# Patient Record
Sex: Male | Born: 1955 | Race: White | Hispanic: No | Marital: Married | Smoking: Current every day smoker
Health system: Southern US, Community
[De-identification: ages and names within clinical notes are randomized; demographics above are authoritative.]

## PROBLEM LIST (undated history)

## (undated) DIAGNOSIS — M549 Dorsalgia, unspecified: Secondary | ICD-10-CM

## (undated) DIAGNOSIS — Z87442 Personal history of urinary calculi: Secondary | ICD-10-CM

## (undated) DIAGNOSIS — I10 Essential (primary) hypertension: Secondary | ICD-10-CM

## (undated) DIAGNOSIS — F17200 Nicotine dependence, unspecified, uncomplicated: Secondary | ICD-10-CM

## (undated) DIAGNOSIS — I2510 Atherosclerotic heart disease of native coronary artery without angina pectoris: Secondary | ICD-10-CM

## (undated) DIAGNOSIS — G4730 Sleep apnea, unspecified: Secondary | ICD-10-CM

## (undated) DIAGNOSIS — M48062 Spinal stenosis, lumbar region with neurogenic claudication: Secondary | ICD-10-CM

## (undated) DIAGNOSIS — M1990 Unspecified osteoarthritis, unspecified site: Secondary | ICD-10-CM

## (undated) HISTORY — DX: Dorsalgia, unspecified: M54.9

## (undated) HISTORY — PX: BACK SURGERY: SHX140

## (undated) HISTORY — PX: CARPAL TUNNEL RELEASE: SHX101

## (undated) HISTORY — PX: CORONARY ARTERY BYPASS GRAFT: SHX141

## (undated) HISTORY — PX: APPENDECTOMY: SHX54

## (undated) HISTORY — PX: KNEE ARTHROSCOPY: SUR90

## (undated) HISTORY — PX: WISDOM TOOTH EXTRACTION: SHX21

## (undated) HISTORY — PX: COLONOSCOPY: SHX174

---

## 2001-10-02 ENCOUNTER — Encounter: Payer: Self-pay | Admitting: Emergency Medicine

## 2001-10-02 ENCOUNTER — Emergency Department (HOSPITAL_COMMUNITY): Admission: EM | Admit: 2001-10-02 | Discharge: 2001-10-02 | Payer: Self-pay | Admitting: Emergency Medicine

## 2013-06-20 ENCOUNTER — Encounter (INDEPENDENT_AMBULATORY_CARE_PROVIDER_SITE_OTHER): Payer: Self-pay

## 2013-06-20 ENCOUNTER — Ambulatory Visit (INDEPENDENT_AMBULATORY_CARE_PROVIDER_SITE_OTHER): Payer: BC Managed Care – PPO | Admitting: Family Medicine

## 2013-06-20 VITALS — BP 125/69 | HR 56 | Temp 98.4°F | Resp 16 | Ht 76.0 in | Wt 262.0 lb

## 2013-06-20 DIAGNOSIS — J111 Influenza due to unidentified influenza virus with other respiratory manifestations: Secondary | ICD-10-CM

## 2013-06-20 DIAGNOSIS — R059 Cough, unspecified: Secondary | ICD-10-CM

## 2013-06-20 DIAGNOSIS — J101 Influenza due to other identified influenza virus with other respiratory manifestations: Secondary | ICD-10-CM

## 2013-06-20 LAB — POCT INFLUENZA A/B
POCT Rapid Influenza A AG: POSITIVE — AB
POCT Rapid Influenza B AG: NEGATIVE

## 2013-06-20 MED ORDER — GUAIFENESIN-CODEINE 100-10 MG/5ML PO SOLN
5.0000 mL | Freq: Three times a day (TID) | ORAL | Status: AC | PRN
Start: 2013-06-20 — End: 2013-06-30

## 2013-06-20 NOTE — Patient Instructions (Signed)
Viral Infection    Antibiotics not indicated    May take robitussin AC but will make you tired and cannot take when working or driving      Please take either 2 extra strength tylenol every 8 hrs or 2 aleve in the AM and 2 in the PM with food    If symptoms not improving, fevers, etc please follow-up

## 2013-06-20 NOTE — Progress Notes (Signed)
Subjective:       Patient ID: Rick Mcdonald is a 58 y.o. male.    HPI  + tob  Works about 14 days straight and goes home every other weekend  Was working this weekend and one of the guys c sim. Complaints  Mild cough and runny nose  Ms aches  Fevers  No n/v/has or facial pain  The following portions of the patient's history were reviewed and updated as appropriate: allergies, current medications, past family history, past medical history, past social history, past surgical history and problem list.    Review of Systems        Objective:     Physical Exam   Nursing note and vitals reviewed.  Constitutional: He is oriented to person, place, and time. He appears well-developed and well-nourished.   HENT:   Right Ear: Tympanic membrane and external ear normal.   Left Ear: Tympanic membrane and external ear normal.   Eyes: Conjunctivae normal are normal.   Neck: Normal range of motion. Neck supple.   Cardiovascular: Normal rate, regular rhythm and normal heart sounds.    Pulmonary/Chest: Effort normal and breath sounds normal. No respiratory distress. He has no wheezes.   Lymphadenopathy:     He has no cervical adenopathy.   Neurological: He is alert and oriented to person, place, and time.   Skin: Skin is warm and dry.           Assessment:       Flu A      Plan:       See avs and orders

## 2017-06-23 ENCOUNTER — Other Ambulatory Visit: Payer: Self-pay | Admitting: Neurosurgery

## 2017-06-28 NOTE — Pre-Procedure Instructions (Signed)
Brian Franco  06/28/2017     No Pharmacies Listed   Your procedure is scheduled on Feb 26  Report to [facility] Admitting at [phone] A.M.  Call this number if you have problems the morning of surgery:  [phone]   Remember:  Do not eat food or drink liquids after midnight.  Take these medicines the morning of surgery with A SIP OF WATER None  Stop taking aspirin, BC's, Goody's, Herbal medications, Fish Oil, Aleve, Ibuprofen, Advil, motrin, Vitamins    Do not wear jewelry, make-up or nail polish.  Do not wear lotions, powders, or perfumes, or deodorant.  Do not shave 48 hours prior to surgery.  Men may shave face and neck.  Do not bring valuables to the hospital.  [facility] is not responsible for any belongings or valuables.  Contacts, dentures or bridgework may not be worn into surgery.  Leave your suitcase in the car.  After surgery it may be brought to your room.  For patients admitted to the hospital, discharge time will be determined by your treatment team.  Patients discharged the day of surgery will not be allowed to drive home.   Special instructions:  [facility] - Preparing for Surgery  Before surgery, you can play an important role.  Because skin is not sterile, your skin needs to be as free of germs as possible.  You can reduce the number of germs on you skin by washing with CHG (chlorahexidine gluconate) soap before surgery.  CHG is an antiseptic cleaner which kills germs and bonds with the skin to continue killing germs even after washing.  Please DO NOT use if you have an allergy to CHG or antibacterial soaps.  If your skin becomes reddened/irritated stop using the CHG and inform your nurse when you arrive at Short Stay.  Do not shave (including legs and underarms) for at least 48 hours prior to the first CHG shower.  You may shave your face.  Please follow these instructions carefully:   1.  Shower with CHG Soap the night before surgery and the    morning of Surgery.  2.  If you choose to wash your hair, wash your hair first as usual with your  normal shampoo.  3.  After you shampoo, rinse your hair and body thoroughly to remove the   Shampoo.  4.  Use CHG as you would any other liquid soap.  You can apply chg directly to the skin and wash gently with scrungie or a clean washcloth.  5.  Apply the CHG Soap to your body ONLY FROM THE NECK DOWN.   Do not use on open wounds or open sores.  Avoid contact with your eyes,  ears, mouth and genitals (private parts).  Wash genitals (private parts)       with your normal soap.  6.  Wash thoroughly, paying special attention to the area where your surgery will be performed.  7.  Thoroughly rinse your body with warm water from the neck down.  8.  DO NOT shower/wash with your normal soap after using and rinsing off the CHG Soap.  9.  Pat yourself dry with a clean towel.            10.  Wear clean pajamas.            11.  Place clean sheets on your bed the night of your first shower and do not sleep with pets.  Day of Surgery  Do not apply any lotions/deoderants the morning of surgery.  Please wear clean clothes to the hospital/surgery center.     Please read over the following fact sheets that you were given. Pain Booklet, Coughing and Deep Breathing, MRSA Information and Surgical Site Infection Prevention

## 2017-06-29 ENCOUNTER — Encounter (HOSPITAL_COMMUNITY): Payer: Self-pay

## 2017-06-29 ENCOUNTER — Other Ambulatory Visit: Payer: Self-pay

## 2017-06-29 ENCOUNTER — Encounter (HOSPITAL_COMMUNITY)
Admission: RE | Admit: 2017-06-29 | Discharge: 2017-06-29 | Disposition: A | Discharge status: Home / Self Care | Payer: PRIVATE HEALTH INSURANCE | Source: Ambulatory Visit | Attending: Neurosurgery | Admitting: Neurosurgery

## 2017-06-29 DIAGNOSIS — Z01818 Encounter for other preprocedural examination: Secondary | ICD-10-CM | POA: Diagnosis present

## 2017-06-29 HISTORY — DX: Spinal stenosis, lumbar region with neurogenic claudication: M48.062

## 2017-06-29 HISTORY — DX: Sleep apnea, unspecified: G47.30

## 2017-06-29 LAB — SURGICAL PCR SCREEN
MRSA, PCR: NEGATIVE
Staphylococcus aureus: POSITIVE — AB

## 2017-06-29 LAB — BASIC METABOLIC PANEL
ANION GAP: 11 (ref 5–15)
BUN: 25 mg/dL — ABNORMAL HIGH (ref 6–20)
CALCIUM: 9.6 mg/dL (ref 8.9–10.3)
CO2: 22 mmol/L (ref 22–32)
Chloride: 107 mmol/L (ref 101–111)
Creatinine, Ser: 0.88 mg/dL (ref 0.61–1.24)
GFR calc Af Amer: >60 mL/min (ref >60)
GLUCOSE: 100 mg/dL — AB (ref 65–99)
Potassium: 4 mmol/L (ref 3.5–5.1)
Sodium: 140 mmol/L (ref 135–145)

## 2017-06-29 LAB — CBC
HCT: 43.3 % (ref 39.0–52.0)
Hemoglobin: 14.5 g/dL (ref 13.0–17.0)
MCH: 30 pg (ref 26.0–34.0)
MCHC: 33.5 g/dL (ref 30.0–36.0)
MCV: 89.5 fL (ref 78.0–100.0)
PLATELETS: 260 10*3/uL (ref 150–400)
RBC: 4.84 MIL/uL (ref 4.22–5.81)
RDW: 13 % (ref 11.5–15.5)
WBC: 9.4 10*3/uL (ref 4.0–10.5)

## 2017-06-29 LAB — TYPE AND SCREEN
ABO/RH(D): A NEG
Antibody Screen: NEGATIVE

## 2017-06-29 LAB — ABO/RH: ABO/RH(D): A NEG

## 2017-06-29 NOTE — Progress Notes (Signed)
Mupirocin Ointment Rx called into [facility] on [address] in [place] for positive PCR of Staph. Pt notified and voiced understanding.

## 2017-06-29 NOTE — Progress Notes (Signed)
[facility]  Cardiologist - Denies  Chest x-ray - Denies  EKG - Denies  Stress Test - Denies  ECHO - Denies  Cardiac Cath - Denies  Sleep Study - Yes- Positive CPAP - Yes- Told to bring mask DOS  LABS- 06/29/17: CBC, BMP, T/S  Anesthesia- No  Pt denies having chest pain, sob, or fever at this time. All instructions explained to the pt, with a verbal understanding of the material. Pt agrees to go over the instructions while at home for a better understanding. The opportunity to ask questions was provided.

## 2017-07-05 ENCOUNTER — Encounter (HOSPITAL_COMMUNITY): Payer: Self-pay | Admitting: Anesthesiology

## 2017-07-05 ENCOUNTER — Inpatient Hospital Stay (HOSPITAL_COMMUNITY): Payer: PRIVATE HEALTH INSURANCE | Admitting: Anesthesiology

## 2017-07-05 ENCOUNTER — Inpatient Hospital Stay (HOSPITAL_COMMUNITY)
Admission: RE | Admit: 2017-07-05 | Discharge: 2017-07-06 | DRG: 455 | Disposition: A | Discharge status: Home / Self Care | Payer: PRIVATE HEALTH INSURANCE | Source: Ambulatory Visit | Attending: Neurosurgery | Admitting: Neurosurgery

## 2017-07-05 ENCOUNTER — Inpatient Hospital Stay (HOSPITAL_COMMUNITY)
Admission: RE | Disposition: A | Discharge status: Home / Self Care | Payer: Self-pay | Source: Ambulatory Visit | Attending: Neurosurgery

## 2017-07-05 ENCOUNTER — Inpatient Hospital Stay (HOSPITAL_COMMUNITY): Payer: PRIVATE HEALTH INSURANCE

## 2017-07-05 DIAGNOSIS — F17210 Nicotine dependence, cigarettes, uncomplicated: Secondary | ICD-10-CM | POA: Diagnosis present

## 2017-07-05 DIAGNOSIS — M21372 Foot drop, left foot: Secondary | ICD-10-CM | POA: Diagnosis present

## 2017-07-05 DIAGNOSIS — M5116 Intervertebral disc disorders with radiculopathy, lumbar region: Secondary | ICD-10-CM | POA: Diagnosis present

## 2017-07-05 DIAGNOSIS — I10 Essential (primary) hypertension: Secondary | ICD-10-CM | POA: Diagnosis present

## 2017-07-05 DIAGNOSIS — M48061 Spinal stenosis, lumbar region without neurogenic claudication: Secondary | ICD-10-CM | POA: Diagnosis present

## 2017-07-05 DIAGNOSIS — M48062 Spinal stenosis, lumbar region with neurogenic claudication: Principal | ICD-10-CM | POA: Diagnosis present

## 2017-07-05 DIAGNOSIS — G4730 Sleep apnea, unspecified: Secondary | ICD-10-CM | POA: Diagnosis present

## 2017-07-05 DIAGNOSIS — E7800 Pure hypercholesterolemia, unspecified: Secondary | ICD-10-CM | POA: Diagnosis present

## 2017-07-05 DIAGNOSIS — M545 Low back pain: Secondary | ICD-10-CM | POA: Diagnosis present

## 2017-07-05 DIAGNOSIS — Z419 Encounter for procedure for purposes other than remedying health state, unspecified: Secondary | ICD-10-CM

## 2017-07-05 SURGERY — POSTERIOR LUMBAR FUSION 2 LEVEL
Anesthesia: General | Site: Back

## 2017-07-05 MED ORDER — METHOCARBAMOL 500 MG PO TABS
500.0000 mg | ORAL_TABLET | Freq: Four times a day (QID) | ORAL | Status: DC | PRN
  Administered 2017-07-05: 500 mg via ORAL

## 2017-07-05 MED ORDER — SODIUM CHLORIDE 0.9% FLUSH: 3.0000 mL | Freq: Two times a day (BID) | INTRAVENOUS | Status: DC

## 2017-07-05 MED ORDER — LIDOCAINE-EPINEPHRINE 1 %-1:100000 IJ SOLN
INTRAMUSCULAR | Status: DC | PRN
  Administered 2017-07-05: 5 mL

## 2017-07-05 MED ORDER — HYDROMORPHONE HCL 1 MG/ML IJ SOLN
INTRAMUSCULAR | Status: AC
  Administered 2017-07-05: 0.5 mg via INTRAVENOUS
  Filled 2017-07-05: qty 1

## 2017-07-05 MED ORDER — OXYCODONE HCL 5 MG/5ML PO SOLN: 5.0000 mg | Freq: Once | ORAL | Status: AC | PRN

## 2017-07-05 MED ORDER — HYDROMORPHONE HCL 1 MG/ML IJ SOLN
INTRAMUSCULAR | Status: AC
  Filled 2017-07-05: qty 0.5

## 2017-07-05 MED ORDER — ALUM & MAG HYDROXIDE-SIMETH 200-200-20 MG/5ML PO SUSP: 30.0000 mL | Freq: Four times a day (QID) | ORAL | Status: DC | PRN

## 2017-07-05 MED ORDER — CHLORHEXIDINE GLUCONATE CLOTH 2 % EX PADS: 6.0000 | MEDICATED_PAD | Freq: Once | CUTANEOUS | Status: DC

## 2017-07-05 MED ORDER — HYDROCODONE-ACETAMINOPHEN 5-325 MG PO TABS: 1.0000 | ORAL_TABLET | ORAL | Status: DC | PRN

## 2017-07-05 MED ORDER — OXYCODONE-ACETAMINOPHEN 5-325 MG PO TABS
1.0000 | ORAL_TABLET | ORAL | Status: DC | PRN
  Administered 2017-07-05 – 2017-07-06 (×4): 2 via ORAL
  Filled 2017-07-05 (×4): qty 2

## 2017-07-05 MED ORDER — FENTANYL CITRATE (PF) 100 MCG/2ML IJ SOLN
INTRAMUSCULAR | Status: DC | PRN
  Administered 2017-07-05: 50 ug via INTRAVENOUS
  Administered 2017-07-05: 150 ug via INTRAVENOUS
  Administered 2017-07-05 (×6): 50 ug via INTRAVENOUS

## 2017-07-05 MED ORDER — OXYCODONE HCL 5 MG PO TABS
5.0000 mg | ORAL_TABLET | Freq: Once | ORAL | Status: AC | PRN
  Administered 2017-07-05: 5 mg via ORAL

## 2017-07-05 MED ORDER — FENTANYL CITRATE (PF) 250 MCG/5ML IJ SOLN
INTRAMUSCULAR | Status: AC
  Filled 2017-07-05: qty 5

## 2017-07-05 MED ORDER — ACETAMINOPHEN 325 MG PO TABS: 650.0000 mg | ORAL_TABLET | ORAL | Status: DC | PRN

## 2017-07-05 MED ORDER — POLYETHYLENE GLYCOL 3350 17 G PO PACK: 17.0000 g | PACK | Freq: Every day | ORAL | Status: DC | PRN

## 2017-07-05 MED ORDER — HYDROCODONE-ACETAMINOPHEN 5-325 MG PO TABS: 2.0000 | ORAL_TABLET | ORAL | Status: DC | PRN

## 2017-07-05 MED ORDER — VANCOMYCIN HCL 1000 MG IV SOLR
INTRAVENOUS | Status: AC
  Filled 2017-07-05: qty 1000

## 2017-07-05 MED ORDER — PANTOPRAZOLE SODIUM 40 MG IV SOLR
40.0000 mg | Freq: Every day | INTRAVENOUS | Status: DC
  Administered 2017-07-05: 40 mg via INTRAVENOUS
  Filled 2017-07-05: qty 40

## 2017-07-05 MED ORDER — MIDAZOLAM HCL 5 MG/5ML IJ SOLN
INTRAMUSCULAR | Status: DC | PRN
  Administered 2017-07-05: 2 mg via INTRAVENOUS

## 2017-07-05 MED ORDER — FENOFIBRATE 160 MG PO TABS
160.0000 mg | ORAL_TABLET | Freq: Every day | ORAL | Status: DC
  Administered 2017-07-06: 160 mg via ORAL
  Filled 2017-07-05: qty 1

## 2017-07-05 MED ORDER — ROCURONIUM BROMIDE 100 MG/10ML IV SOLN
INTRAVENOUS | Status: DC | PRN
  Administered 2017-07-05: 70 mg via INTRAVENOUS
  Administered 2017-07-05: 10 mg via INTRAVENOUS

## 2017-07-05 MED ORDER — BUPIVACAINE LIPOSOME 1.3 % IJ SUSP
20.0000 mL | INTRAMUSCULAR | Status: AC
  Administered 2017-07-05: 20 mL
  Filled 2017-07-05: qty 20

## 2017-07-05 MED ORDER — FENTANYL CITRATE (PF) 100 MCG/2ML IJ SOLN: 25.0000 ug | INTRAMUSCULAR | Status: DC | PRN

## 2017-07-05 MED ORDER — SODIUM CHLORIDE 0.9 % IV SOLN: 250.0000 mL | INTRAVENOUS | Status: DC

## 2017-07-05 MED ORDER — PROPOFOL 10 MG/ML IV BOLUS
INTRAVENOUS | Status: AC
  Filled 2017-07-05: qty 20

## 2017-07-05 MED ORDER — MORPHINE SULFATE (PF) 4 MG/ML IV SOLN
2.0000 mg | INTRAVENOUS | Status: DC | PRN
  Administered 2017-07-05: 2 mg via INTRAVENOUS
  Filled 2017-07-05: qty 1

## 2017-07-05 MED ORDER — HYDROMORPHONE HCL 1 MG/ML IJ SOLN
0.5000 mg | INTRAMUSCULAR | Status: DC | PRN
  Administered 2017-07-05 (×4): 0.5 mg via INTRAVENOUS

## 2017-07-05 MED ORDER — CEFAZOLIN SODIUM-DEXTROSE 2-4 GM/100ML-% IV SOLN
2.0000 g | Freq: Three times a day (TID) | INTRAVENOUS | Status: AC
  Administered 2017-07-05 – 2017-07-06 (×2): 2 g via INTRAVENOUS
  Filled 2017-07-05 (×2): qty 100

## 2017-07-05 MED ORDER — PHENOL 1.4 % MT LIQD: 1.0000 | OROMUCOSAL | Status: DC | PRN

## 2017-07-05 MED ORDER — THROMBIN 5000 UNITS EX SOLR
CUTANEOUS | Status: AC
  Filled 2017-07-05: qty 5000

## 2017-07-05 MED ORDER — METHOCARBAMOL 500 MG PO TABS
ORAL_TABLET | ORAL | Status: AC
  Administered 2017-07-05: 500 mg via ORAL
  Filled 2017-07-05: qty 1

## 2017-07-05 MED ORDER — METHYLPREDNISOLONE ACETATE 80 MG/ML IJ SUSP
INTRAMUSCULAR | Status: AC
  Filled 2017-07-05: qty 1

## 2017-07-05 MED ORDER — EPHEDRINE SULFATE-NACL 50-0.9 MG/10ML-% IV SOSY
PREFILLED_SYRINGE | INTRAVENOUS | Status: DC | PRN
  Administered 2017-07-05 (×2): 5 mg via INTRAVENOUS

## 2017-07-05 MED ORDER — 0.9 % SODIUM CHLORIDE (POUR BTL) OPTIME
TOPICAL | Status: DC | PRN
  Administered 2017-07-05: 1000 mL

## 2017-07-05 MED ORDER — ACETAMINOPHEN 325 MG PO TABS: 325.0000 mg | ORAL_TABLET | ORAL | Status: DC | PRN

## 2017-07-05 MED ORDER — KETOROLAC TROMETHAMINE 15 MG/ML IJ SOLN
15.0000 mg | Freq: Four times a day (QID) | INTRAMUSCULAR | Status: DC
  Administered 2017-07-05 – 2017-07-06 (×3): 15 mg via INTRAVENOUS
  Filled 2017-07-05 (×3): qty 1

## 2017-07-05 MED ORDER — FLEET ENEMA 7-19 GM/118ML RE ENEM: 1.0000 | ENEMA | Freq: Once | RECTAL | Status: DC | PRN

## 2017-07-05 MED ORDER — BISACODYL 10 MG RE SUPP: 10.0000 mg | Freq: Every day | RECTAL | Status: DC | PRN

## 2017-07-05 MED ORDER — LIDOCAINE HCL (CARDIAC) 20 MG/ML IV SOLN
INTRAVENOUS | Status: DC | PRN
  Administered 2017-07-05: 60 mg via INTRAVENOUS

## 2017-07-05 MED ORDER — KCL IN DEXTROSE-NACL 20-5-0.45 MEQ/L-%-% IV SOLN: INTRAVENOUS | Status: DC

## 2017-07-05 MED ORDER — ZOLPIDEM TARTRATE 5 MG PO TABS: 5.0000 mg | ORAL_TABLET | Freq: Every evening | ORAL | Status: DC | PRN

## 2017-07-05 MED ORDER — LIDOCAINE-EPINEPHRINE 1 %-1:100000 IJ SOLN
INTRAMUSCULAR | Status: AC
  Filled 2017-07-05: qty 1

## 2017-07-05 MED ORDER — ACETAMINOPHEN 500 MG PO TABS
1000.0000 mg | ORAL_TABLET | Freq: Once | ORAL | Status: AC
Start: 2017-07-05 — End: 2017-07-05
  Administered 2017-07-05: 1000 mg via ORAL

## 2017-07-05 MED ORDER — ACETAMINOPHEN 650 MG RE SUPP: 650.0000 mg | RECTAL | Status: DC | PRN

## 2017-07-05 MED ORDER — ONDANSETRON HCL 4 MG/2ML IJ SOLN
INTRAMUSCULAR | Status: DC | PRN
  Administered 2017-07-05: 4 mg via INTRAVENOUS

## 2017-07-05 MED ORDER — CEFAZOLIN SODIUM-DEXTROSE 2-4 GM/100ML-% IV SOLN
2.0000 g | INTRAVENOUS | Status: AC
  Administered 2017-07-05 (×2): 2 g via INTRAVENOUS

## 2017-07-05 MED ORDER — ACETAMINOPHEN 160 MG/5ML PO SOLN: 325.0000 mg | ORAL | Status: DC | PRN

## 2017-07-05 MED ORDER — DEXAMETHASONE SODIUM PHOSPHATE 10 MG/ML IJ SOLN
INTRAMUSCULAR | Status: DC | PRN
  Administered 2017-07-05: 10 mg via INTRAVENOUS

## 2017-07-05 MED ORDER — ACETAMINOPHEN 500 MG PO TABS
ORAL_TABLET | ORAL | Status: AC
  Filled 2017-07-05: qty 2

## 2017-07-05 MED ORDER — ONDANSETRON HCL 4 MG/2ML IJ SOLN: 4.0000 mg | Freq: Four times a day (QID) | INTRAMUSCULAR | Status: DC | PRN

## 2017-07-05 MED ORDER — BUPIVACAINE HCL (PF) 0.5 % IJ SOLN
INTRAMUSCULAR | Status: AC
  Filled 2017-07-05: qty 30

## 2017-07-05 MED ORDER — THROMBIN (RECOMBINANT) 5000 UNITS EX SOLR
OROMUCOSAL | Status: DC | PRN
  Administered 2017-07-05: 12:00:00 via TOPICAL

## 2017-07-05 MED ORDER — BUPIVACAINE HCL (PF) 0.5 % IJ SOLN
INTRAMUSCULAR | Status: DC | PRN
  Administered 2017-07-05: 5 mL

## 2017-07-05 MED ORDER — HYDROMORPHONE HCL 1 MG/ML IJ SOLN
INTRAMUSCULAR | Status: DC | PRN
  Administered 2017-07-05 (×2): 0.5 mg via INTRAVENOUS

## 2017-07-05 MED ORDER — SODIUM CHLORIDE 0.9 % IV SOLN
INTRAVENOUS | Status: DC | PRN
  Administered 2017-07-05: 15:00:00 via INTRAVENOUS

## 2017-07-05 MED ORDER — MENTHOL 3 MG MT LOZG: 1.0000 | LOZENGE | OROMUCOSAL | Status: DC | PRN

## 2017-07-05 MED ORDER — PROPOFOL 10 MG/ML IV BOLUS
INTRAVENOUS | Status: DC | PRN
  Administered 2017-07-05: 160 mg via INTRAVENOUS

## 2017-07-05 MED ORDER — MIDAZOLAM HCL 2 MG/2ML IJ SOLN
INTRAMUSCULAR | Status: AC
  Filled 2017-07-05: qty 2

## 2017-07-05 MED ORDER — DOCUSATE SODIUM 100 MG PO CAPS
100.0000 mg | ORAL_CAPSULE | Freq: Two times a day (BID) | ORAL | Status: DC
  Administered 2017-07-05 – 2017-07-06 (×2): 100 mg via ORAL
  Filled 2017-07-05 (×3): qty 1

## 2017-07-05 MED ORDER — CITALOPRAM HYDROBROMIDE 20 MG PO TABS
20.0000 mg | ORAL_TABLET | Freq: Every day | ORAL | Status: DC
  Administered 2017-07-06: 20 mg via ORAL
  Filled 2017-07-05: qty 1

## 2017-07-05 MED ORDER — METHOCARBAMOL 750 MG PO TABS
750.0000 mg | ORAL_TABLET | Freq: Four times a day (QID) | ORAL | Status: DC | PRN
  Administered 2017-07-06 (×2): 750 mg via ORAL
  Filled 2017-07-05 (×2): qty 1

## 2017-07-05 MED ORDER — SODIUM CHLORIDE 0.9% FLUSH: 3.0000 mL | INTRAVENOUS | Status: DC | PRN

## 2017-07-05 MED ORDER — THROMBIN 20000 UNITS EX SOLR
CUTANEOUS | Status: AC
  Filled 2017-07-05: qty 20000

## 2017-07-05 MED ORDER — OXYCODONE HCL 5 MG PO TABS
ORAL_TABLET | ORAL | Status: AC
  Administered 2017-07-05: 5 mg via ORAL
  Filled 2017-07-05: qty 1

## 2017-07-05 MED ORDER — DEXTROSE 5 % IV SOLN
500.0000 mg | Freq: Four times a day (QID) | INTRAVENOUS | Status: DC | PRN
Start: 2017-07-05 — End: 2017-07-05
  Filled 2017-07-05: qty 5

## 2017-07-05 MED ORDER — LACTATED RINGERS IV SOLN
INTRAVENOUS | Status: DC
  Administered 2017-07-05 (×2): via INTRAVENOUS
  Administered 2017-07-05: 50 mL/h via INTRAVENOUS

## 2017-07-05 MED ORDER — ONDANSETRON HCL 4 MG PO TABS: 4.0000 mg | ORAL_TABLET | Freq: Four times a day (QID) | ORAL | Status: DC | PRN

## 2017-07-05 MED ORDER — METHOCARBAMOL 1000 MG/10ML IJ SOLN
500.0000 mg | Freq: Four times a day (QID) | INTRAVENOUS | Status: DC | PRN
  Filled 2017-07-05: qty 5

## 2017-07-05 MED ORDER — OXYCODONE HCL 5 MG PO TABS: 5.0000 mg | ORAL_TABLET | ORAL | Status: DC | PRN

## 2017-07-05 MED ORDER — OXYCODONE-ACETAMINOPHEN 7.5-325 MG PO TABS
1.0000 | ORAL_TABLET | Freq: Three times a day (TID) | ORAL | Status: DC | PRN
Start: 2017-07-05 — End: 2017-07-05

## 2017-07-05 MED ORDER — CEFAZOLIN SODIUM-DEXTROSE 2-4 GM/100ML-% IV SOLN
INTRAVENOUS | Status: AC
  Filled 2017-07-05: qty 100

## 2017-07-05 SURGICAL SUPPLY — 78 items
ADH SKN CLS APL DERMABOND .7 (GAUZE/BANDAGES/DRESSINGS) ×1
BASKET BONE COLLECTION (BASKET) ×3 IMPLANT
BLADE CLIPPER SURG (BLADE) IMPLANT
BUR MATCHSTICK NEURO 3.0 LAGG (BURR) ×3 IMPLANT
BUR PRECISION FLUTE 5.0 (BURR) ×3 IMPLANT
CAGE MAS PLIF 9X9X23-8 LUMBAR (Cage) ×4 IMPLANT
CAGE PLIF 8X9X23-12 LUMBAR (Cage) ×6 IMPLANT
CANISTER SUCT 3000ML PPV (MISCELLANEOUS) ×3 IMPLANT
CARTRIDGE OIL MAESTRO DRILL (MISCELLANEOUS) ×1 IMPLANT
CONT SPEC 4OZ CLIKSEAL STRL BL (MISCELLANEOUS) ×3 IMPLANT
COVER BACK TABLE 60X90IN (DRAPES) ×3 IMPLANT
DECANTER SPIKE VIAL GLASS SM (MISCELLANEOUS) ×3 IMPLANT
DERMABOND ADVANCED (GAUZE/BANDAGES/DRESSINGS) ×2
DERMABOND ADVANCED .7 DNX12 (GAUZE/BANDAGES/DRESSINGS) ×1 IMPLANT
DIFFUSER DRILL AIR PNEUMATIC (MISCELLANEOUS) ×3 IMPLANT
DRAPE C-ARM 42X72 X-RAY (DRAPES) ×3 IMPLANT
DRAPE C-ARMOR (DRAPES) ×3 IMPLANT
DRAPE LAPAROTOMY 100X72X124 (DRAPES) ×3 IMPLANT
DRAPE POUCH INSTRU U-SHP 10X18 (DRAPES) ×3 IMPLANT
DRAPE SURG 17X23 STRL (DRAPES) ×3 IMPLANT
DRSG OPSITE POSTOP 4X8 (GAUZE/BANDAGES/DRESSINGS) ×2 IMPLANT
DURAPREP 26ML APPLICATOR (WOUND CARE) ×3 IMPLANT
ELECT REM PT RETURN 9FT ADLT (ELECTROSURGICAL) ×3
ELECTRODE REM PT RTRN 9FT ADLT (ELECTROSURGICAL) ×1 IMPLANT
EVACUATOR 1/8 PVC DRAIN (DRAIN) IMPLANT
GAUZE SPONGE 4X4 12PLY STRL (GAUZE/BANDAGES/DRESSINGS) ×3 IMPLANT
GAUZE SPONGE 4X4 16PLY XRAY LF (GAUZE/BANDAGES/DRESSINGS) ×3 IMPLANT
GLOVE BIO SURGEON STRL SZ8 (GLOVE) ×6 IMPLANT
GLOVE BIOGEL PI IND STRL 8 (GLOVE) ×2 IMPLANT
GLOVE BIOGEL PI IND STRL 8.5 (GLOVE) ×2 IMPLANT
GLOVE BIOGEL PI INDICATOR 8 (GLOVE) ×4
GLOVE BIOGEL PI INDICATOR 8.5 (GLOVE) ×4
GLOVE ECLIPSE 8.0 STRL XLNG CF (GLOVE) ×6 IMPLANT
GLOVE EXAM NITRILE LRG STRL (GLOVE) IMPLANT
GLOVE EXAM NITRILE XL STR (GLOVE) IMPLANT
GLOVE EXAM NITRILE XS STR PU (GLOVE) IMPLANT
GLOVE SURG SS PI 6.0 STRL IVOR (GLOVE) ×2 IMPLANT
GOWN STRL REUS W/ TWL LRG LVL3 (GOWN DISPOSABLE) IMPLANT
GOWN STRL REUS W/ TWL XL LVL3 (GOWN DISPOSABLE) ×3 IMPLANT
GOWN STRL REUS W/TWL 2XL LVL3 (GOWN DISPOSABLE) ×9 IMPLANT
GOWN STRL REUS W/TWL LRG LVL3 (GOWN DISPOSABLE)
GOWN STRL REUS W/TWL XL LVL3 (GOWN DISPOSABLE) ×9
HEMOSTAT POWDER SURGIFOAM 1G (HEMOSTASIS) ×3 IMPLANT
KIT BASIN OR (CUSTOM PROCEDURE TRAY) ×3 IMPLANT
KIT POSITION SURG JACKSON T1 (MISCELLANEOUS) ×3 IMPLANT
KIT ROOM TURNOVER OR (KITS) ×3 IMPLANT
MILL MEDIUM DISP (BLADE) ×3 IMPLANT
NDL HYPO 21X1.5 SAFETY (NEEDLE) IMPLANT
NDL HYPO 25X1 1.5 SAFETY (NEEDLE) ×1 IMPLANT
NDL SPNL 18GX3.5 QUINCKE PK (NEEDLE) IMPLANT
NEEDLE HYPO 21X1.5 SAFETY (NEEDLE) ×3 IMPLANT
NEEDLE HYPO 25X1 1.5 SAFETY (NEEDLE) ×3 IMPLANT
NEEDLE SPNL 18GX3.5 QUINCKE PK (NEEDLE) IMPLANT
NS IRRIG 1000ML POUR BTL (IV SOLUTION) ×3 IMPLANT
OIL CARTRIDGE MAESTRO DRILL (MISCELLANEOUS) ×3
PACK LAMINECTOMY NEURO (CUSTOM PROCEDURE TRAY) ×3 IMPLANT
PAD ARMBOARD 7.5X6 YLW CONV (MISCELLANEOUS) ×9 IMPLANT
PATTIES SURGICAL .5 X.5 (GAUZE/BANDAGES/DRESSINGS) IMPLANT
PATTIES SURGICAL .5 X1 (DISPOSABLE) IMPLANT
PATTIES SURGICAL 1X1 (DISPOSABLE) IMPLANT
ROD RELIN-O LORD 5.5X65MM (Rod) ×3 IMPLANT
ROD RELINE TI LORD 5.5X70 (Rod) ×2 IMPLANT
SCREW LOCK RELINE 5.5 TULIP (Screw) ×12 IMPLANT
SCREW RELINE-O POLY 6.5X45 (Screw) ×8 IMPLANT
SCREW RELINE-O POLY 6.5X50MM (Screw) ×4 IMPLANT
SPONGE LAP 4X18 X RAY DECT (DISPOSABLE) IMPLANT
SPONGE SURGIFOAM ABS GEL 100 (HEMOSTASIS) ×3 IMPLANT
STAPLER SKIN PROX WIDE 3.9 (STAPLE) IMPLANT
SUT VIC AB 1 CT1 18XBRD ANBCTR (SUTURE) ×2 IMPLANT
SUT VIC AB 1 CT1 8-18 (SUTURE) ×9
SUT VIC AB 2-0 CT1 18 (SUTURE) ×6 IMPLANT
SUT VIC AB 3-0 SH 8-18 (SUTURE) ×8 IMPLANT
SYR 20CC LL (SYRINGE) ×3 IMPLANT
SYR 5ML LL (SYRINGE) IMPLANT
TOWEL GREEN STERILE (TOWEL DISPOSABLE) ×3 IMPLANT
TOWEL GREEN STERILE FF (TOWEL DISPOSABLE) ×3 IMPLANT
TRAY FOLEY W/METER SILVER 16FR (SET/KITS/TRAYS/PACK) ×3 IMPLANT
WATER STERILE IRR 1000ML POUR (IV SOLUTION) ×3 IMPLANT

## 2017-07-05 NOTE — Anesthesia Preprocedure Evaluation (Signed)
Anesthesia Evaluation  Patient identified by MRN, date of birth, ID band Patient awake    Reviewed: Allergy & Precautions, NPO status , Patient's Chart, lab work & pertinent test results  History of Anesthesia Complications Negative for: history of anesthetic complications  Airway Mallampati: II  TM Distance: >3 FB Neck ROM: Full    Dental  (+) Teeth Intact   Pulmonary sleep apnea and Continuous Positive Airway Pressure Ventilation , Current Smoker,    breath sounds clear to auscultation       Cardiovascular negative cardio ROS   Rhythm:Regular     Neuro/Psych negative neurological ROS  negative psych ROS   GI/Hepatic negative GI ROS, Neg liver ROS,   Endo/Other  negative endocrine ROS  Renal/GU negative Renal ROS     Musculoskeletal   Abdominal   Peds  Hematology negative hematology ROS (+)   Anesthesia Other Findings   Reproductive/Obstetrics                             Anesthesia Physical Anesthesia Plan  ASA: II  Anesthesia Plan: General   Post-op Pain Management:    Induction: Intravenous  PONV Risk Score and Plan: 1 and Ondansetron  Airway Management Planned: Oral ETT  Additional Equipment: None  Intra-op Plan:   Post-operative Plan: Extubation in OR  Informed Consent: I have reviewed the patients History and Physical, chart, labs and discussed the procedure including the risks, benefits and alternatives for the proposed anesthesia with the patient or authorized representative who has indicated his/her understanding and acceptance.   Dental advisory given  Plan Discussed with: CRNA and Surgeon  Anesthesia Plan Comments:         Anesthesia Quick Evaluation

## 2017-07-05 NOTE — Brief Op Note (Signed)
07/05/2017  3:54 PM  PATIENT:  Brian Franco  62 y.o. male  PRE-OPERATIVE DIAGNOSIS:  Lumbar stenosis with neurogenic claudication, herniated lumbar disc, recurrent spinal stenosis, radiculopathy, lumbago L 45 and L 5 S 1 levels  POST-OPERATIVE DIAGNOSIS:  Lumbar stenosis with neurogenic claudication, herniated lumbar disc, recurrent spinal stenosis, radiculopathy, lumbago L 45 and L 5 S 1 levels  PROCEDURE:  Procedure(s): Redo Decompression Lumbar five-Sacral one with Lumbar four-five Lumbar five -sacral one Posterior lumbar interbody fusion (N/A) with PEEK cages, autograft, pedicle screw fixation, posterolateral arthrodesis  Decompression through site of previous laminectomy was greater in complexity and difficulty than standard PLIF procedure.  SURGEON:  Surgeon(s) and Role:    * Maeola HarmanStern, Tatayana Beshears, MD - Primary    * Julio SicksPool, Henry, MD - Assisting  PHYSICIAN ASSISTANT:   ASSISTANTS: Poteat, RN   ANESTHESIA:   general  EBL:  450 mL   BLOOD ADMINISTERED:130  CC CELLSAVER  DRAINS: none   LOCAL MEDICATIONS USED:  MARCAINE    and LIDOCAINE   SPECIMEN:  No Specimen  DISPOSITION OF SPECIMEN:  N/A  COUNTS:  YES  TOURNIQUET:  * No tourniquets in log *  DICTATION: Patient is 62 year old man with  HNP, spondylosis, scoliosis, recurrent stenosis, DDD, radiculopathy L4/5, L5/S1. He has a severe left leg pain and weakness. It was elected to take him to surgery for redo decompression and fusion at L4/5 and L5/S1 levels.    Procedure: Patient was placed in a prone position on the [place] table after smooth and uncomplicated induction of general endotracheal anesthesia. His low back was prepped and draped in usual sterile fashion with betadine scrub and DuraPrep. Area of incision was infiltrated with local lidocaine. Incision was made to the lumbodorsal fascia was incised and exposure was performed of the L4/5, L5/S1 spinous processes laminae facet joint and transverse processes. Intraoperative  x-ray was obtained which confirmed correct orientation. A total laminectomy of L4 and L5 was performed with disarticulation of the facet joints at this level and thorough decompression was performed of both L4, L5 and S1 nerve roots along with the common dural tube. There was densely adherent spondylytic material compressing the thecal sac and both L 4, L 5, and S 1 nerve roots, particularly at the L 5 S 1 level.  Decompression was greater than would be typically performed for simple interbody fusion, particularly on the left, which required redo laminectomy and painstaking dissection through scar tissue. A thorough discectomy and preparation of the endplates was performed at both the L4/5 and L5/S1 levels.  The interspaces were packed with PEEK cages (9 x 9 x 23 mm x 8 degree at L 45 and 8 x 9 x 23 mm  x 12 degree at L5/S1) . 10 cc Bone autograft was packed within the interspace medial to the second spacer at each level. The posterolateral region was extensively decorticated and pedicle probes were placed at L4, L5 and S1 bilaterally. Intraoperative fluoroscopy confirmed correct orientationin the AP and lateral plane. 45 x 6.5 mm pedicle screws were placed at S1 bilaterally and 45 x 6.5 mm screws placed at L5 bilaterally and 50 x 6.405mm screws were placed at L4 bilaterally. Final x-rays demonstrated well-positioned interbody grafts and pedicle screw fixation. 65 mm lordotic rods were placed and locked down in situ on the right and 70 mm rod on the left and the posterolateral region was packed with the remaining bone graft (20 cc on each side).  Long-acting Marcaine was injected in the deep  musculature.    Fascia was closed with 1 Vicryl sutures skin edges were reapproximated 2 and 3-0 Vicryl sutures. The wound is dressed with Dermabond and an occlusive Honeycomb dressing. The patient was extubated in the operating room and taken to recovery in stable satisfactory condition having tolerated the operation well. Counts  were correct at the end of the case.    PLAN OF CARE: Admit to inpatient   PATIENT DISPOSITION:  PACU - hemodynamically stable.   Delay start of Pharmacological VTE agent (>24hrs) due to surgical blood loss or risk of bleeding: yes

## 2017-07-05 NOTE — Progress Notes (Signed)
Awake, alert, conversant.  MAEW with good strength bilateral PF/DF/EHL.  Patient states that leg pain and numbness are improved.

## 2017-07-05 NOTE — H&P (Signed)
Patient ID:   [id number] Patient: Brian Franco  Date of Birth: [birth year] Visit Type: Office Visit   Date: 06/22/2017 01:45 PM Provider: Danae OrleansJoseph D. Venetia MaxonStern MD   This 62 year old male presents for back pain.   History of Present Illness: 1.  back pain  Brian BayleyBarry Hickey is a 62 year old man with back and left leg pain which she grades at 7 to 8/10 in severity.  He notes numbness and tingling in his left leg along with weakness in his left leg and says that for the last 2 weeks he has had decreased ability to lift his left foot.  The he says his began in December of 2018.  He has been on Endocet 7.5/325 3 times daily for the last 14 years and is also taking ibuprofen 800 mg 3 times daily.   The patient has undergone chiropractic, 10s unit, ESI treatments without any improvement and says he is actually made him feel worse.   He had a lumbar laminectomy in 2005 at L5-S1.  In addition he has had appendectomy in 1977 and right knee arthroscopy in the 80s.   Medical problems include elevated cholesterol angry mood outbursts and sleep apnea.   The patient is 6 ft 4 in tall 256 lb and smokes 2 packs per day of cigarettes.   The patient has numbness and weakness in his left leg and is having difficulty with using his left foot.  The patient has significant disc disease and recurrent stenosis at L4-5 and L5-S1 levels.  He has marked L5 nerve root compression on the left.           MEDICATIONS(added, continued or stopped this visit): Started Medication Directions Instruction Stopped   citalopram 20 mg tablet take 1 tablet by oral route  every day     Endocet 7.5 mg-325 mg tablet take 1 tablet by oral route  every 6 hours as needed     fenofibrate 160 mg tablet take 1 tablet by oral route  every day     ibuprofen 800 mg tablet take 1 tablet by oral route 3 times every day with food       ALLERGIES: Ingredient Reaction Medication Name Comment  NO KNOWN ALLERGIES     No known  allergies. Reviewed, no changes.    Vitals Date Temp F BP Pulse Ht In Wt Lb BMI BSA Pain Score  06/22/2017  145/74 56 76 256 31.16  7/10     PHYSICAL EXAM General Level of Distress: no acute distress Overall Appearance: normal  Head and Face  Right Left  Fundoscopic Exam:  normal normal    Cardiovascular Cardiac: regular rate and rhythm without murmur  Right Left  Carotid Pulses: normal normal  Respiratory Lungs: clear to auscultation  Neurological Orientation: normal Recent and Remote Memory: normal Attention Span and Concentration:   normal Language: normal Fund of Knowledge: normal  Right Left Sensation: normal normal Upper Extremity Coordination: normal normal  Lower Extremity Coordination: normal normal  Musculoskeletal Gait and Station: normal  Right Left Upper Extremity Muscle Strength: normal normal Lower Extremity Muscle Strength: normal normal Upper Extremity Muscle Tone:  normal normal Lower Extremity Muscle Tone: normal normal   Motor Strength Upper and lower extremity motor strength was tested in the clinically pertinent muscles. Any abnormal findings will be noted below.   Right Left Tib Anterior:  3/5 EHL:  3/5   Deep Tendon Reflexes  Right Left Biceps: normal normal Triceps: normal normal Brachioradialis: normal normal Patellar: normal  normal Achilles: normal normal  Sensory Sensation was tested at L1 to S1.   Cranial Nerves II. Optic Nerve/Visual Fields: normal III. Oculomotor: normal IV. Trochlear: normal V. Trigeminal: normal VI. Abducens: normal VII. Facial: normal VIII. Acoustic/Vestibular: normal IX. Glossopharyngeal: normal X. Vagus: normal XI. Spinal Accessory: normal XII. Hypoglossal: normal  Motor and other Tests Lhermittes: negative Rhomberg: negative Pronator drift: absent     Right Left Hoffman's: normal normal Clonus: normal normal Babinski: normal normal SLR: positive at 30 degrees positive at 30  degrees Patrick's Pearlean Brownie): negative negative Toe Walk: normal normal Toe Lift: normal normal Heel Walk: normal normal SI Joint: nontender nontender   Additional Findings:  Left sciatic notch discomfort to palpation.  Patient is able to bend to within 4 in of the floor with his upper extremities outstretched.  His left foot is cool to the touch compared to the right.  Left hip adductor 4-out of 5     IMPRESSION New onset foot drop on the left with dorsiflexion, EHL, hip adductor weakness.  This has been gradually worsening over the last several weeks.  The patient is a smoker and knows he needs to stop smoking.  He has had prior laminectomy surgery   Completed Orders (this encounter) Order Details Reason Side Interpretation Result Initial Treatment Date Region  Tobacco cessation counseling         Hypertension education Patient to follow up with primary care provider.        Dietary management education, guidance, and counseling patient encouraged to eat a well balanced diet         Assessment/Plan # Detail Type Description   1. Assessment Lumbar radiculopathy (M54.16).       2. Assessment Left leg weakness (R29.898).       3. 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       4. Assessment Lumbar stenosis with neurogenic claudication (M48.062).   Plan Orders Aspen Lo Sag Rigid Panel Quick.       5. Assessment Essential (primary) hypertension (I10).       6. Assessment Body mass index (BMI) 31.0-31.9, adult (Z68.31).   Plan Orders Today's instructions / counseling include(s) Dietary management education, guidance, and counseling.           Pain Management Plan Pain Scale: 7/10. Method: Numeric Pain Intensity Scale. Location: back. Onset: 04/21/2017. Duration: varies. Quality: discomforting. Pain management follow-up plan of care: Patient is taking OTC pain relievers for relief..  Redo decompression L5-S1 with posterior lumbar interbody fusion L4-5 and L5-S1 levels at [facility] on Tuesday July 05, 2017.  Patient was fitted for LSO brace.  Risks and benefits were discussed in detail with the patient in wishes to proceed with surgery.  He knows he needs to stop smoking.   Orders: Diagnostic Procedures: Assessment Procedure  M54.16 Lumbar Spine- AP/Lat  Instruction(s)/Education: Assessment Instruction   Tobacco cessation counseling  I10 Hypertension education  Z68.31 Dietary management education, guidance, and counseling  Miscellaneous: Assessment   M48.062 Aspen Lo Sag Rigid Panel Quick             Provider:  Venetia Maxon MD, Danae Orleans 06/25/2017 4:25 PM  Dictation edited by: Danae Orleans. Venetia Maxon    CC Providers: Alcide Goodness  [facility] [address] [place], [place] [address]-              Electronically signed by Danae Orleans. Venetia Maxon MD on 06/25/2017 04:25 PM

## 2017-07-05 NOTE — Op Note (Signed)
07/05/2017  3:54 PM  PATIENT:  Brian Franco  62 y.o. male  PRE-OPERATIVE DIAGNOSIS:  Lumbar stenosis with neurogenic claudication, herniated lumbar disc, recurrent spinal stenosis, radiculopathy, lumbago L 45 and L 5 S 1 levels  POST-OPERATIVE DIAGNOSIS:  Lumbar stenosis with neurogenic claudication, herniated lumbar disc, recurrent spinal stenosis, radiculopathy, lumbago L 45 and L 5 S 1 levels  PROCEDURE:  Procedure(s): Redo Decompression Lumbar five-Sacral one with Lumbar four-five Lumbar five -sacral one Posterior lumbar interbody fusion (N/A) with PEEK cages, autograft, pedicle screw fixation, posterolateral arthrodesis  Decompression through site of previous laminectomy was greater in complexity and difficulty than standard PLIF procedure.  SURGEON:  Surgeon(s) and Role:    * Maeola HarmanStern, Amos Gaber, MD - Primary    * Julio SicksPool, Henry, MD - Assisting  PHYSICIAN ASSISTANT:   ASSISTANTS: Poteat, RN   ANESTHESIA:   general  EBL:  450 mL   BLOOD ADMINISTERED:130  CC CELLSAVER  DRAINS: none   LOCAL MEDICATIONS USED:  MARCAINE    and LIDOCAINE   SPECIMEN:  No Specimen  DISPOSITION OF SPECIMEN:  N/A  COUNTS:  YES  TOURNIQUET:  * No tourniquets in log *  DICTATION: Patient is 62 year old man with  HNP, spondylosis, scoliosis, recurrent stenosis, DDD, radiculopathy L4/5, L5/S1. He has a severe left leg pain and weakness. It was elected to take him to surgery for redo decompression and fusion at L4/5 and L5/S1 levels.    Procedure: Patient was placed in a prone position on the [place] table after smooth and uncomplicated induction of general endotracheal anesthesia. His low back was prepped and draped in usual sterile fashion with betadine scrub and DuraPrep. Area of incision was infiltrated with local lidocaine. Incision was made to the lumbodorsal fascia was incised and exposure was performed of the L4/5, L5/S1 spinous processes laminae facet joint and transverse processes. Intraoperative  x-ray was obtained which confirmed correct orientation. A total laminectomy of L4 and L5 was performed with disarticulation of the facet joints at this level and thorough decompression was performed of both L4, L5 and S1 nerve roots along with the common dural tube. There was densely adherent spondylytic material compressing the thecal sac and both L 4, L 5, and S 1 nerve roots, particularly at the L 5 S 1 level.  Decompression was greater than would be typically performed for simple interbody fusion, particularly on the left, which required redo laminectomy and painstaking dissection through scar tissue. A thorough discectomy and preparation of the endplates was performed at both the L4/5 and L5/S1 levels.  The interspaces were packed with PEEK cages (9 x 9 x 23 mm x 8 degree at L 45 and 8 x 9 x 23 mm  x 12 degree at L5/S1) . 10 cc Bone autograft was packed within the interspace medial to the second spacer at each level. The posterolateral region was extensively decorticated and pedicle probes were placed at L4, L5 and S1 bilaterally. Intraoperative fluoroscopy confirmed correct orientationin the AP and lateral plane. 45 x 6.5 mm pedicle screws were placed at S1 bilaterally and 45 x 6.5 mm screws placed at L5 bilaterally and 50 x 6.405mm screws were placed at L4 bilaterally. Final x-rays demonstrated well-positioned interbody grafts and pedicle screw fixation. 65 mm lordotic rods were placed and locked down in situ on the right and 70 mm rod on the left and the posterolateral region was packed with the remaining bone graft (20 cc on each side).  Long-acting Marcaine was injected in the deep  musculature.    Fascia was closed with 1 Vicryl sutures skin edges were reapproximated 2 and 3-0 Vicryl sutures. The wound is dressed with Dermabond and an occlusive Honeycomb dressing. The patient was extubated in the operating room and taken to recovery in stable satisfactory condition having tolerated the operation well. Counts  were correct at the end of the case.    PLAN OF CARE: Admit to inpatient   PATIENT DISPOSITION:  PACU - hemodynamically stable.   Delay start of Pharmacological VTE agent (>24hrs) due to surgical blood loss or risk of bleeding: yes

## 2017-07-05 NOTE — Transfer of Care (Signed)
Immediate Anesthesia Transfer of Care Note  Patient: Brian Franco  Procedure(s) Performed: Redo Decompression Lumbar five-Sacral one with Lumbar four-five Lumbar five -sacral one Posterior lumbar interbody fusion (N/A Back)  Patient Location: PACU  Anesthesia Type:General  Level of Consciousness: awake, alert , oriented and patient cooperative  Airway & Oxygen Therapy: Patient Spontanous Breathing and Patient connected to nasal cannula oxygen  Post-op Assessment: Report given to RN and Post -op Vital signs reviewed and stable  Post vital signs: Reviewed and stable  Last Vitals:  Vitals:   07/05/17 1044  BP: 140/71  Pulse: (!) 51  Resp: 20  Temp: 36.6 C  SpO2: 94%    Last Pain:  Vitals:   07/05/17 1044  TempSrc: Oral      Patients Stated Pain Goal: 3 (07/05/17 1044)  Complications: No apparent anesthesia complications

## 2017-07-06 LAB — GLUCOSE, CAPILLARY: Glucose-Capillary: 207 mg/dL — ABNORMAL HIGH (ref 65–99)

## 2017-07-06 MED ORDER — OXYCODONE-ACETAMINOPHEN 5-325 MG PO TABS: 1.0000 | ORAL_TABLET | ORAL | 0 refills | Status: DC | PRN

## 2017-07-06 MED ORDER — METHOCARBAMOL 750 MG PO TABS: 750.0000 mg | ORAL_TABLET | Freq: Four times a day (QID) | ORAL | 1 refills | Status: DC | PRN

## 2017-07-06 MED FILL — Thrombin For Soln 5000 Unit: CUTANEOUS | Qty: 5000 | Status: AC

## 2017-07-06 NOTE — Discharge Instructions (Signed)

## 2017-07-06 NOTE — Evaluation (Signed)
Physical Therapy Evaluation Patient Details Name: Brian Franco MRN: [id number] DOB: [birth year] Today's Date: 07/06/2017   History of Present Illness  Pt is a 62 y/o male s/p PLIF L4-S1. PMH including but not limited to current smoker and lumbar laminectomy in 2005 at L5-S1.  Clinical Impression  Pt presented supine in bed with HOB elevated, awake and willing to participate in therapy session. Pt's spouse present throughout session as well. Pt lives in a single level house with one step to enter with his wife who will be able to provide 24/7 supervision/assistance for several days initially. Prior to admission, pt reported that he was independent with all functional mobility and ADLs. Pt currently able to perform bed mobility and transfers with supervision. He ambulated in hallway without use of an AD with supervision and successfully completed stair training with supervision and spouse present. PT provided pt with back precautions handout and reviewed with pt throughout. No further acute PT needs identified at this time. PT signing off.     Follow Up Recommendations No PT follow up    Equipment Recommendations  None recommended by PT    Recommendations for Other Services       Precautions / Restrictions Precautions Precautions: Back Precaution Booklet Issued: Yes (comment) Precaution Comments: Reviewed 3/3 back precautions wiht pt and pt's spouse throughout Required Braces or Orthoses: Spinal Brace Spinal Brace: Lumbar corset;Applied in sitting position Restrictions Weight Bearing Restrictions: No      Mobility  Bed Mobility Overal bed mobility: Needs Assistance Bed Mobility: Rolling;Sidelying to Sit Rolling: Supervision Sidelying to sit: Supervision       General bed mobility comments: cueing for log roll technique, supervision for safety  Transfers Overall transfer level: Needs assistance Equipment used: None Transfers: Sit to/from Stand Sit to Stand: Supervision          General transfer comment: no instability with transition, supervision for safety  Ambulation/Gait Ambulation/Gait assistance: Supervision Ambulation Distance (Feet): 400 Feet Assistive device: None Gait Pattern/deviations: Step-through pattern;Decreased dorsiflexion - left Gait velocity: decreased Gait velocity interpretation: Below normal speed for age/gender General Gait Details: mildly decreased DF on L LE with swing phase but no foot drag; mild instability but no overt LOB or need for physical assistance, supervision for safety  Stairs Stairs: Yes Stairs assistance: Supervision Stair Management: One rail Right;Alternating pattern;Step to pattern;Forwards Number of Stairs: 12 General stair comments: no instability or LOB, supervision for safety, pt steady and cautious  Wheelchair Mobility    Modified Rankin (Stroke Patients Only)       Balance Overall balance assessment: Needs assistance Sitting-balance support: Feet supported Sitting balance-Leahy Scale: Normal     Standing balance support: During functional activity;No upper extremity supported Standing balance-Leahy Scale: Good                               Pertinent Vitals/Pain Pain Assessment: Faces Faces Pain Scale: Hurts a little bit Pain Location: incision site Pain Descriptors / Indicators: Sore Pain Intervention(s): Monitored during session;Repositioned    Home Living Family/patient expects to be discharged to:: Private residence Living Arrangements: Spouse/significant other Available Help at Discharge: Family;Available 24 hours/day Type of Home: House Home Access: Stairs to enter Entrance Stairs-Rails: None Entrance Stairs-Number of Steps: 1 Home Layout: One level Home Equipment: Shower seat;Crutches      Prior Function Level of Independence: Independent         Comments: continues to work  Hand Dominance        Extremity/Trunk Assessment   Upper Extremity  Assessment Upper Extremity Assessment: Defer to OT evaluation    Lower Extremity Assessment Lower Extremity Assessment: Overall WFL for tasks assessed    Cervical / Trunk Assessment Cervical / Trunk Assessment: Other exceptions Cervical / Trunk Exceptions: s/p lumbar sx  Communication   Communication: No difficulties  Cognition Arousal/Alertness: Awake/alert Behavior During Therapy: WFL for tasks assessed/performed Overall Cognitive Status: Within Functional Limits for tasks assessed                                        General Comments      Exercises     Assessment/Plan    PT Assessment Patent does not need any further PT services  PT Problem List         PT Treatment Interventions      PT Goals (Current goals can be found in the Care Plan section)  Acute Rehab PT Goals Patient Stated Goal: return home    Frequency     Barriers to discharge        Co-evaluation               AM-PAC PT "6 Clicks" Daily Activity  Outcome Measure Difficulty turning over in bed (including adjusting bedclothes, sheets and blankets)?: None Difficulty moving from lying on back to sitting on the side of the bed? : None Difficulty sitting down on and standing up from a chair with arms (e.g., wheelchair, bedside commode, etc,.)?: None Help needed moving to and from a bed to chair (including a wheelchair)?: None Help needed walking in hospital room?: None Help needed climbing 3-5 steps with a railing? : None 6 Click Score: 24    End of Session   Activity Tolerance: Patient tolerated treatment well Patient left: in bed;with call bell/phone within reach;with family/visitor present;Other (comment)(sitting EOB) Nurse Communication: Mobility status PT Visit Diagnosis: Other abnormalities of gait and mobility (R26.89)    Time: 1610-9604 PT Time Calculation (min) (ACUTE ONLY): 12 min   Charges:   PT Evaluation $PT Eval Low Complexity: 1 Low     PT G  Codes:        [place], PT, [place] [phone]   Alessandra Bevels Xochilt Conant 07/06/2017, 9:20 AM

## 2017-07-06 NOTE — Progress Notes (Addendum)
Subjective: Patient reports "I'm doing good. I don't hurt"  Objective: Vital signs in last 24 hours: Temp:  [97.5 F (36.4 C)-98.7 F (37.1 C)] 98.6 F (37 C) (02/27 0725) Pulse Rate:  [51-61] 55 (02/27 0725) Resp:  [13-22] 20 (02/27 0725) BP: (129-165)/(61-82) 135/82 (02/27 0725) SpO2:  [18 %-96 %] 96 % (02/27 0725)  Intake/Output from previous day: 02/26 0701 - 02/27 0700 In: 2200 [P.O.:120; I.V.:1650; Blood:130; IV Piggyback:300] Out: 1480 [Urine:1030; Blood:450] Intake/Output this shift: No intake/output data recorded.  Alert, conversant. Wife present. Pt reports incisional soreness only, noting resolution of leg pain. Full strength BLE. Incision flat without erythema or drainage beneath honeycomb and Dermabond.   Lab Results: No results for input(s): WBC, HGB, HCT, PLT in the last 72 hours. BMET No results for input(s): NA, K, CL, CO2, GLUCOSE, BUN, CREATININE, CALCIUM in the last 72 hours.  Studies/Results: Dg Lumbar Spine 2-3 Views  Result Date: 07/05/2017 CLINICAL DATA:  Lumbar fusion. EXAM: LUMBAR SPINE - 2-3 VIEW; DG C-ARM 61-120 MIN COMPARISON:  MRI 06/09/2017 FINDINGS: Lumbar spine numbered with the lowest segmented appearing lumbar vertebrae on lateral view as L5. L4 through S1 posterior pedicle screws. Interbody fusion devices L4-L5 and L5-S1. Hardware intact. Anatomic alignment. IMPRESSION: Postsurgical changes lumbosacral spine as above. Electronically Signed   By: Maisie Fushomas  Register   On: 07/05/2017 15:36   Dg C-arm 1-60 Min  Result Date: 07/05/2017 CLINICAL DATA:  Lumbar fusion. EXAM: LUMBAR SPINE - 2-3 VIEW; DG C-ARM 61-120 MIN COMPARISON:  MRI 06/09/2017 FINDINGS: Lumbar spine numbered with the lowest segmented appearing lumbar vertebrae on lateral view as L5. L4 through S1 posterior pedicle screws. Interbody fusion devices L4-L5 and L5-S1. Hardware intact. Anatomic alignment. IMPRESSION: Postsurgical changes lumbosacral spine as above. Electronically Signed   By:  Maisie Fushomas  Register   On: 07/05/2017 15:36    Assessment/Plan: Improved  LOS: 1 day  Per DrStern, d/c IV, d/c to home. Rx's for prn home use will be eRx'ed to his pharmacy from the office. Pt has office f/u scheduled.   Georgiann Cockeroteat, Brian 07/06/2017, 7:27 AM   Patient is doing well.  Discharge home.

## 2017-07-06 NOTE — Progress Notes (Signed)
Pt refuse CPAP for the night. CPAP machine is at the bedside. Pt is stable at this time.

## 2017-07-06 NOTE — Progress Notes (Signed)
Patient alert and oriented, mae's well, voiding adequate amount of urine, swallowing without difficulty, c/o mild pain at time of discharge. Patient discharged home with family. Script and discharged instructions given to patient. Patient and family stated understanding of instructions given. Patient has an appointment with Dr. Stern.     

## 2017-07-06 NOTE — Discharge Summary (Signed)
Physician Discharge Summary  Patient ID: Brian Franco MRN: [id number] DOB/AGE: [birth year] 62 y.o.  Admit date: 07/05/2017 Discharge date: 07/06/2017  Admission Diagnoses:Lumbar stenosis with neurogenic claudication, herniated lumbar disc, recurrent spinal stenosis, radiculopathy, lumbago L 45 and L 5 S 1 levels    Discharge Diagnoses: Lumbar stenosis with neurogenic claudication, herniated lumbar disc, recurrent spinal stenosis, radiculopathy, lumbago L 45 and L 5 S 1 levels s/p Redo Decompression Lumbar five-Sacral one with Lumbar four-five Lumbar five -sacral one Posterior lumbar interbody fusion (N/A) with PEEK cages, autograft, pedicle screw fixation, posterolateral arthrodesis  Decompression through site of previous laminectomy was greater in complexity and difficulty than standard PLIF procedure.     Active Problems:   Degenerative lumbar spinal stenosis   Discharged Condition: good  Hospital Course: Brian BayleyBarry Suto was admitted for surgery with dx stenosis and radiculopathy. Following uncomplicated redo decompression at L5-S1 and PLIF L4-5, L5-S1, he recovered well. He has mobilized nicely.   Consults: None  Significant Diagnostic Studies: radiology: X-Ray: intra-op  Treatments: surgery: Redo Decompression Lumbar five-Sacral one with Lumbar four-five Lumbar five -sacral one Posterior lumbar interbody fusion (N/A) with PEEK cages, autograft, pedicle screw fixation, posterolateral arthrodesis  Decompression through site of previous laminectomy was greater in complexity and difficulty than standard PLIF procedure.    Discharge Exam: Blood pressure 135/82, pulse (!) 55, temperature 98.6 F (37 C), temperature source Oral, resp. rate 20, SpO2 96 %. Alert, conversant. Wife present. Pt reports incisional soreness only, noting resolution of leg pain. Full strength BLE. Incision flat without erythema or drainage beneath honeycomb and  Dermabond.    Disposition: Discharge to home. Rx's Percocet 5/325 and Robaxin 750mg  for prn home use will be eRx'ed to his pharmacy from the office. Pt has office f/u scheduled.     Allergies as of 07/06/2017   No Known Allergies     Medication List    STOP taking these medications   ibuprofen 800 MG tablet Commonly known as:  ADVIL,MOTRIN     TAKE these medications   CELEXA 20 MG tablet Generic drug:  citalopram Take 20 mg by mouth daily.   fenofibrate 160 MG tablet Take 160 mg by mouth daily.   methocarbamol 750 MG tablet Commonly known as:  ROBAXIN Take 1 tablet (750 mg total) by mouth every 6 (six) hours as needed for muscle spasms.   PERCOCET 7.5-325 MG tablet Generic drug:  oxyCODONE-acetaminophen Take 1 tablet by mouth 3 (three) times daily as needed (severe pain). What changed:  Another medication with the same name was added. Make sure you understand how and when to take each.   oxyCODONE-acetaminophen 5-325 MG tablet Commonly known as:  PERCOCET/ROXICET Take 1-2 tablets by mouth every 4 (four) hours as needed for severe pain. What changed:  You were already taking a medication with the same name, and this prescription was added. Make sure you understand how and when to take each.        Signed: Dorian HeckleSTERN,Juletta Berhe D, MD 07/06/2017, 8:28 AM

## 2017-07-06 NOTE — Evaluation (Signed)
Occupational Therapy Evaluation Patient Details Name: Brian Franco MRN: [id number] DOB: Sep 22, 1955 Today's Date: 07/06/2017    History of Present Illness Pt is a 62 y/o male s/p PLIF L4-S1. PMH including but not limited to current smoker and lumbar laminectomy in 2005 at L5-S1.   Clinical Impression   Patient evaluated by Occupational Therapy with no further acute OT needs identified. All education has been completed and the patient has no further questions. See below for any follow-up Occupational Therapy or equipment needs. OT to sign off. Thank you for referral.  .    Follow Up Recommendations  No OT follow up    Equipment Recommendations  None recommended by OT(plans to get 3n1 on his own)    Recommendations for Other Services       Precautions / Restrictions Precautions Precautions: Back Precaution Booklet Issued: Yes (comment) Precaution Comments: reviewed back handouts as related to adls Required Braces or Orthoses: Spinal Brace Spinal Brace: Lumbar corset Restrictions Weight Bearing Restrictions: No      Mobility Bed Mobility Overal bed mobility: Modified Independent Bed Mobility: Rolling;Sidelying to Sit Rolling: Supervision Sidelying to sit: Supervision       General bed mobility comments: cueing for log roll technique, supervision for safety  Transfers Overall transfer level: Needs assistance Equipment used: None Transfers: Sit to/from Stand Sit to Stand: Supervision         General transfer comment: no instability with transition, supervision for safety    Balance Overall balance assessment: Needs assistance Sitting-balance support: Feet supported Sitting balance-Leahy Scale: Normal     Standing balance support: During functional activity;No upper extremity supported Standing balance-Leahy Scale: Good                             ADL either performed or assessed with clinical judgement   ADL Overall ADL's : Modified  independent                                       General ADL Comments: pt is able to cross bil le and reports R is weaker. pt advised to dress R first then L. pt and wife present for all education. pt has x2 dogs at home and advised on animal care and safety.   Back handout provided and reviewed adls in detail. Pt educated on: clothing between brace, never sleep in brace, set an alarm at night for medication, avoid sitting for long periods of time, correct bed positioning for sleeping, correct sequence for bed mobility, avoiding lifting more than 5 pounds and never wash directly over incision. All education is complete and patient indicates understanding.     Vision         Perception     Praxis      Pertinent Vitals/Pain Pain Assessment: No/denies pain Faces Pain Scale: Hurts a little bit Pain Location: incision site Pain Descriptors / Indicators: Sore Pain Intervention(s): Monitored during session;Repositioned     Hand Dominance Right   Extremity/Trunk Assessment Upper Extremity Assessment Upper Extremity Assessment: Overall WFL for tasks assessed   Lower Extremity Assessment Lower Extremity Assessment: Defer to PT evaluation   Cervical / Trunk Assessment Cervical / Trunk Assessment: Other exceptions Cervical / Trunk Exceptions: s/p surg   Communication Communication Communication: No difficulties   Cognition Arousal/Alertness: Awake/alert Behavior During Therapy: WFL for tasks assessed/performed Overall Cognitive Status: Within Functional  Limits for tasks assessed                                     General Comments       Exercises     Shoulder Instructions      Home Living Family/patient expects to be discharged to:: Private residence Living Arrangements: Spouse/significant other Available Help at Discharge: Family;Available 24 hours/day Type of Home: House Home Access: Stairs to enter [employer] of Steps:  1 Entrance Stairs-Rails: None Home Layout: One level     Bathroom Shower/Tub: Walk-in shower         Home Equipment: Shower seat;Crutches          Prior Functioning/Environment Level of Independence: Independent        Comments: continues to work        OT Problem List:        OT Treatment/Interventions:      OT Goals(Current goals can be found in the care plan section) Acute Rehab OT Goals Patient Stated Goal: return home  OT Frequency:     Barriers to D/C:            Co-evaluation              AM-PAC PT "6 Clicks" Daily Activity     Outcome Measure Help from another person eating meals?: None Help from another person taking care of personal grooming?: None Help from another person toileting, which includes using toliet, bedpan, or urinal?: None Help from another person bathing (including washing, rinsing, drying)?: None Help from another person to put on and taking off regular upper body clothing?: None Help from another person to put on and taking off regular lower body clothing?: None 6 Click Score: 24   End of Session Nurse Communication: Mobility status;Precautions  Activity Tolerance:   Patient left: in bed;with call bell/phone within reach;with family/visitor present                   Time: 0821-0853 OT Time Calculation (min): 32 min Charges:  OT General Charges $OT Visit: 1 Visit OT Evaluation $OT Eval Moderate Complexity: 1 Mod OT Treatments $Self Care/Home Management : 8-22 mins G-Codes:      Brian Franco   OTR/L Pager: [phone] Office: [phone] .   Brian Franco 07/06/2017, 10:50 AM

## 2017-07-07 NOTE — Anesthesia Postprocedure Evaluation (Signed)
Anesthesia Post Note  Patient: Brian Franco  Procedure(s) Performed: Redo Decompression Lumbar five-Sacral one with Lumbar four-five Lumbar five -sacral one Posterior lumbar interbody fusion (N/A Back)     Patient location during evaluation: PACU Anesthesia Type: General Level of consciousness: awake and alert Pain management: pain level controlled Vital Signs Assessment: post-procedure vital signs reviewed and stable Respiratory status: spontaneous breathing, nonlabored ventilation, respiratory function stable and patient connected to nasal cannula oxygen Cardiovascular status: blood pressure returned to baseline and stable Postop Assessment: no apparent nausea or vomiting Anesthetic complications: no    Last Vitals:  Vitals:   07/06/17 0515 07/06/17 0725  BP: 129/61 135/82  Pulse: (!) 51 (!) 55  Resp: (!) 22 20  Temp: 37 C 37 C  SpO2: 96% 96%    Last Pain:  Vitals:   07/06/17 1035  TempSrc:   PainSc: 3                  Delsy Etzkorn

## 2017-07-12 MED FILL — Sodium Chloride IV Soln 0.9%: INTRAVENOUS | Qty: 1000 | Status: AC

## 2017-07-12 MED FILL — Heparin Sodium (Porcine) Inj 1000 Unit/ML: INTRAMUSCULAR | Qty: 60 | Status: AC

## 2018-10-09 ENCOUNTER — Other Ambulatory Visit: Payer: Self-pay | Admitting: Neurosurgery

## 2018-10-09 DIAGNOSIS — M5416 Radiculopathy, lumbar region: Secondary | ICD-10-CM

## 2018-10-12 ENCOUNTER — Ambulatory Visit
Admission: RE | Admit: 2018-10-12 | Discharge: 2018-10-12 | Disposition: A | Discharge status: Home / Self Care | Payer: BC Managed Care – PPO | Source: Ambulatory Visit | Attending: Neurosurgery | Admitting: Neurosurgery

## 2018-10-12 ENCOUNTER — Other Ambulatory Visit: Payer: Self-pay

## 2018-10-12 DIAGNOSIS — M5416 Radiculopathy, lumbar region: Secondary | ICD-10-CM

## 2018-10-12 MED ORDER — GADOBENATE DIMEGLUMINE 529 MG/ML IV SOLN
20.0000 mL | Freq: Once | INTRAVENOUS | Status: AC | PRN
  Administered 2018-10-12: 20 mL via INTRAVENOUS

## 2019-01-23 ENCOUNTER — Other Ambulatory Visit: Payer: Self-pay | Admitting: Pain Medicine

## 2019-01-23 DIAGNOSIS — M5416 Radiculopathy, lumbar region: Secondary | ICD-10-CM

## 2019-01-23 DIAGNOSIS — M961 Postlaminectomy syndrome, not elsewhere classified: Secondary | ICD-10-CM

## 2019-01-23 DIAGNOSIS — G834 Cauda equina syndrome: Secondary | ICD-10-CM

## 2019-02-21 ENCOUNTER — Ambulatory Visit
Admission: RE | Admit: 2019-02-21 | Discharge: 2019-02-21 | Disposition: A | Discharge status: Home / Self Care | Payer: BC Managed Care – PPO | Source: Ambulatory Visit | Attending: Pain Medicine | Admitting: Pain Medicine

## 2019-02-21 DIAGNOSIS — M5416 Radiculopathy, lumbar region: Secondary | ICD-10-CM

## 2019-02-21 DIAGNOSIS — M961 Postlaminectomy syndrome, not elsewhere classified: Secondary | ICD-10-CM

## 2019-02-21 DIAGNOSIS — G834 Cauda equina syndrome: Secondary | ICD-10-CM

## 2019-02-21 MED ORDER — GADOBENATE DIMEGLUMINE 529 MG/ML IV SOLN
20.0000 mL | Freq: Once | INTRAVENOUS | Status: AC | PRN
  Administered 2019-02-21: 20 mL via INTRAVENOUS

## 2019-03-20 ENCOUNTER — Other Ambulatory Visit: Payer: Self-pay | Admitting: Neurosurgery

## 2019-03-27 NOTE — H&P (Signed)
Patient ID:   [id number] Patient: Brian Franco  Date of Birth: 06-21-1955 Visit Type: Office Visit   Date: 03/14/2019 09:30 AM Provider: Marchia Meiers. Vertell Limber MD   This 63 year old male presents for MRI Review.  HISTORY OF PRESENT ILLNESS:  1.  MRI Review  Patient did get relief with injections although I do not have the records of the specific injection he received.  The improvement did not last a great deal of time.  Currently the patient is complaining of pain into his left groin to his knee.  I reviewed the lumbar MRI which shows progressive deterioration and stenosis at the L3-4 level on the left.  There is some degeneration at L2-3 as well but this is not as severely affected.  Based on the patient's persistent symptoms and imaging findings, I have recommended left L3-4 XLIF with lateral plate.  He wants to consider his options and will call me if he wishes to schedule the surgery.         Medical/Surgical/Interim History Reviewed, no change.     PAST MEDICAL HISTORY, SURGICAL HISTORY, FAMILY HISTORY, SOCIAL HISTORY AND REVIEW OF SYSTEMS I have reviewed the patient's past medical, surgical, family and social history as well as the comprehensive review of systems as included on the [facility] history form dated 10/06/2018, which I have signed.  Family History:  Reviewed, no changes.    Social History: Reviewed, no changes.   MEDICATIONS: (added, continued or stopped this visit) Started Medication Directions Instruction Stopped   citalopram 20 mg tablet take 1 tablet by oral route  every day    01/02/2019 Endocet 7.5 mg-325 mg tablet take 1 tablet by oral route  every 6 hours as needed     fenofibrate 160 mg tablet take 1 tablet by oral route  every day    01/02/2019 ibuprofen 800 mg tablet take 1 tablet by oral route 3 times every day with food as needed    08/01/2017 Robaxin-750 750 mg tablet take 1 tablet by oral route  every 6 hours  prn spasm       ALLERGIES: Ingredient Reaction Medication Name Comment  NO KNOWN ALLERGIES     No known allergies. Reviewed, no changes.    PHYSICAL EXAM:   Vitals Date Temp F BP Pulse Ht In Wt Lb BMI BSA Pain Score  03/14/2019 97.7 167/83 56 76 255.6 31.11  4/10      IMPRESSION:   Progressive degeneration and left-sided stenosis at the L3-4 level.  While there is some degeneration L2-3 this is not as severely affected and I do not believe this will require surgical intervention.  PLAN:  Patient will follow-up with me and schedule surgery if he wishes to proceed with this.  This will consist of left L3-4 XL IF with lateral plating.  Patient already has LSO brace.  Orders: Office Procedures/Services: Assessment Service Comments   Need to obtain notes from pain management regarding most recent injection(s)    Diagnostic Procedures: Assessment Procedure  M54.16 Lumbar Spine- AP/Lat  Instruction(s)/Education: Assessment Instruction   Tobacco cessation counseling  R03.0 Lifestyle education  Z68.31 Dietary management education, guidance, and counseling   Completed Orders (this encounter) Order Details Reason Side Interpretation Result Initial Treatment Date Region  Tobacco cessation counseling         Lifestyle education Patient will monitor and contact primary care physician if needed.        Dietary management education, guidance, and counseling Encouraged patient  to eat well balanced diet.         Assessment/Plan   # Detail Type Description   1. Assessment Radiculopathy, lumbar region (M54.16).       2. 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       3. Assessment Elevated blood-pressure reading, w/o diagnosis of htn (R03.0).       4. Assessment Body mass index (BMI) 31.0-31.9, adult (Z68.31).   Plan Orders Today's instructions / counseling include(s) Dietary management education, guidance, and counseling. Clinical  information/comments: Encouraged patient to eat well balanced diet.       5. Other Orders Orders not associated to today's assessments.   Plan Orders Need to obtain notes from pain management regarding most recent injection(s).     Pain Management Plan Pain Scale: 4/10. Method: Numeric Pain Intensity Scale. Location: back. Onset: 04/21/2017. Duration: varies. Quality: discomforting. Pain management follow-up plan of care: Patient will continue medication management.              Provider:  Danae Orleans. Venetia Maxon MD  03/17/2019 01:17 PM    Dictation edited by: Danae Orleans. Venetia Maxon    CC Providers: Alcide Goodness  [facility] [address] [place], [place] [address]-               Electronically signed by Danae Orleans. Venetia Maxon MD on 03/17/2019 01:17 PM

## 2019-03-29 NOTE — Pre-Procedure Instructions (Signed)
[facility] - [place], [place] - [address] [facility] [address] [place] [address] Phone: [phone] Fax: [phone]  [phone] - [place], [place] - [address] [facility] [address] [place] [address] Phone: [phone] Fax: [phone]      Your procedure is scheduled on  04-03-19  Report to [facility] En[facility] "[facility]" at 0945 A.M., and check in at the Admitting office.  Call this number if you have problems the morning of surgery:  [phone]  Call [phone] if you have any questions prior to your surgery date Monday-Friday 8am-4pm    Remember:  Do not eat or drink after midnight the night before your surgery  Take these medicines the morning of surgery with A SIP OF WATER : citalopram (CELEXA) oxyCODONE-acetaminophen (PERCOCET)  7 days prior to surgery STOP taking any Aspirin (unless otherwise instructed by your surgeon), Aleve, Naproxen, Ibuprofen, Motrin, Advil, Goody's, BC's, all herbal medications, fish oil, and all vitamins.    The Morning of Surgery  Do not wear jewelry, make-up or nail polish.  Do not wear lotions, powders, or perfumes/colognes, or deodorant  Men may shave face and neck.  Do not bring valuables to the hospital.  [facility] is not responsible for any belongings or valuables.  If you are a smoker, DO NOT Smoke 24 hours prior to surgery  If you wear a CPAP at night please bring your mask, tubing, and machine the morning of surgery   Remember that you must have someone to transport you home after your surgery, and remain with you for 24 hours if you are discharged the same day.   Please bring cases for contacts, glasses, hearing aids, dentures or bridgework because it cannot be worn into surgery.    Leave your suitcase in the car.  After surgery it may be brought to your room.  For patients admitted to the hospital, discharge time will be determined by your treatment  team.  Patients discharged the day of surgery will not be allowed to drive home.    Special instructions:   [facility]- Preparing For Surgery  Before surgery, you can play an important role. Because skin is not sterile, your skin needs to be as free of germs as possible. You can reduce the number of germs on your skin by washing with CHG (chlorahexidine gluconate) Soap before surgery.  CHG is an antiseptic cleaner which kills germs and bonds with the skin to continue killing germs even after washing.    Oral Hygiene is also important to reduce your risk of infection.  Remember - BRUSH YOUR TEETH THE MORNING OF SURGERY WITH YOUR REGULAR TOOTHPASTE  Please do not use if you have an allergy to CHG or antibacterial soaps. If your skin becomes reddened/irritated stop using the CHG.  Do not shave (including legs and underarms) for at least 48 hours prior to first CHG shower. It is OK to shave your face.  Please follow these instructions carefully.   1. Shower the NIGHT BEFORE SURGERY and the MORNING OF SURGERY with CHG Soap.   2. If you chose to wash your hair, wash your hair first as usual with your normal shampoo.  3. After you shampoo, rinse your hair and body thoroughly to remove the shampoo.  4. Use CHG as you would any other liquid soap. You can apply CHG directly to the skin and wash gently with a scrungie or a clean washcloth.   5.  Apply the CHG Soap to your body ONLY FROM THE NECK DOWN.  Do not use on open wounds or open sores. Avoid contact with your eyes, ears, mouth and genitals (private parts). Wash Face and genitals (private parts)  with your normal soap.   6. Wash thoroughly, paying special attention to the area where your surgery will be performed.  7. Thoroughly rinse your body with warm water from the neck down.  8. DO NOT shower/wash with your normal soap after using and rinsing off the CHG Soap.  9. Pat yourself dry with a CLEAN TOWEL.  10. Wear CLEAN PAJAMAS to bed  the night before surgery, wear comfortable clothes the morning of surgery  11. Place CLEAN SHEETS on your bed the night of your first shower and DO NOT SLEEP WITH PETS.  Day of Surgery:  Please shower the morning of surgery with the CHG soap Do not apply any deodorants/lotions. Please wear clean clothes to the hospital/surgery center.   Remember to brush your teeth WITH YOUR REGULAR TOOTHPASTE.   Please read over the fact sheets that you were given.

## 2019-03-30 ENCOUNTER — Encounter (HOSPITAL_COMMUNITY)
Admission: RE | Admit: 2019-03-30 | Discharge: 2019-03-30 | Disposition: A | Discharge status: Home / Self Care | Payer: BC Managed Care – PPO | Source: Ambulatory Visit | Attending: Neurosurgery | Admitting: Neurosurgery

## 2019-03-30 ENCOUNTER — Encounter (HOSPITAL_COMMUNITY): Payer: Self-pay

## 2019-03-30 ENCOUNTER — Other Ambulatory Visit (HOSPITAL_COMMUNITY): Payer: BC Managed Care – PPO

## 2019-03-30 ENCOUNTER — Other Ambulatory Visit: Payer: Self-pay

## 2019-03-30 ENCOUNTER — Other Ambulatory Visit (HOSPITAL_COMMUNITY)
Admission: RE | Admit: 2019-03-30 | Discharge: 2019-03-30 | Disposition: A | Discharge status: Home / Self Care | Payer: BC Managed Care – PPO | Source: Ambulatory Visit | Attending: Neurosurgery | Admitting: Neurosurgery

## 2019-03-30 DIAGNOSIS — Z20828 Contact with and (suspected) exposure to other viral communicable diseases: Secondary | ICD-10-CM | POA: Diagnosis not present

## 2019-03-30 DIAGNOSIS — Z01812 Encounter for preprocedural laboratory examination: Secondary | ICD-10-CM | POA: Insufficient documentation

## 2019-03-30 DIAGNOSIS — M48062 Spinal stenosis, lumbar region with neurogenic claudication: Secondary | ICD-10-CM | POA: Insufficient documentation

## 2019-03-30 LAB — CBC
HCT: 43.2 % (ref 39.0–52.0)
Hemoglobin: 14.4 g/dL (ref 13.0–17.0)
MCH: 30.8 pg (ref 26.0–34.0)
MCHC: 33.3 g/dL (ref 30.0–36.0)
MCV: 92.5 fL (ref 80.0–100.0)
Platelets: 237 10*3/uL (ref 150–400)
RBC: 4.67 MIL/uL (ref 4.22–5.81)
RDW: 13.2 % (ref 11.5–15.5)
WBC: 8.5 10*3/uL (ref 4.0–10.5)
nRBC: 0 % (ref 0.0–0.2)

## 2019-03-30 LAB — BASIC METABOLIC PANEL
Anion gap: 13 (ref 5–15)
BUN: 22 mg/dL (ref 8–23)
CO2: 24 mmol/L (ref 22–32)
Calcium: 9.6 mg/dL (ref 8.9–10.3)
Chloride: 104 mmol/L (ref 98–111)
Creatinine, Ser: 0.93 mg/dL (ref 0.61–1.24)
GFR calc Af Amer: >60 mL/min (ref >60)
GFR calc non Af Amer: >60 mL/min (ref >60)
Glucose, Bld: 120 mg/dL — ABNORMAL HIGH (ref 70–99)
Potassium: 3.7 mmol/L (ref 3.5–5.1)
Sodium: 141 mmol/L (ref 135–145)

## 2019-03-30 LAB — TYPE AND SCREEN
ABO/RH(D): A NEG
Antibody Screen: NEGATIVE

## 2019-03-30 LAB — SURGICAL PCR SCREEN
MRSA, PCR: NEGATIVE
Staphylococcus aureus: NEGATIVE

## 2019-03-30 NOTE — Progress Notes (Signed)
Covid testing scheduled for today. No h/o recent travel. No h/o recent exposure to Covid positive patients. No h/o symptoms concerning for Covid.  PCP - Kipreos,Nicholas  Cardiologist - denies  Pain-Dr Terressa Koyanagi  Chest x-ray - denies  EKG - denies  Stress Test - denies  ECHO - denies  Cardiac Cath - denies  AICD-denies PM-denies LOOP-denies  Sleep Study - Y CPAP - Y  LABS-PCR,CBC,BMP  ASA-denies  ERAS-NA  HA1C-denies Fasting Blood Sugar -  Checks Blood Sugar _____ times a day  Anesthesia-N  Pt denies having chest pain, sob, or fever at this time. All instructions explained to the pt, with a verbal understanding of the material. Pt agrees to go over the instructions while at home for a better understanding. Pt also instructed to self quarantine after being tested for COVID-19. The opportunity to ask questions was provided.

## 2019-04-01 LAB — NOVEL CORONAVIRUS, NAA (HOSP ORDER, SEND-OUT TO REF LAB; TAT 18-24 HRS): SARS-CoV-2, NAA: NOT DETECTED

## 2019-04-02 MED ORDER — DEXTROSE 5 % IV SOLN
3.0000 g | INTRAVENOUS | Status: AC
  Administered 2019-04-03: 3 g via INTRAVENOUS
  Filled 2019-04-02: qty 3
  Filled 2019-04-02: qty 3000

## 2019-04-03 ENCOUNTER — Inpatient Hospital Stay (HOSPITAL_COMMUNITY): Payer: BC Managed Care – PPO | Admitting: Certified Registered Nurse Anesthetist

## 2019-04-03 ENCOUNTER — Other Ambulatory Visit: Payer: Self-pay

## 2019-04-03 ENCOUNTER — Inpatient Hospital Stay (HOSPITAL_COMMUNITY)
Admission: RE | Admit: 2019-04-03 | Discharge: 2019-04-04 | DRG: 460 | Disposition: A | Discharge status: Home / Self Care | Payer: BC Managed Care – PPO | Attending: Neurosurgery | Admitting: Neurosurgery

## 2019-04-03 ENCOUNTER — Inpatient Hospital Stay (HOSPITAL_COMMUNITY): Payer: BC Managed Care – PPO

## 2019-04-03 ENCOUNTER — Encounter (HOSPITAL_COMMUNITY)
Admission: RE | Disposition: A | Discharge status: Home / Self Care | Payer: Self-pay | Source: Home / Self Care | Attending: Neurosurgery

## 2019-04-03 ENCOUNTER — Encounter (HOSPITAL_COMMUNITY): Payer: Self-pay

## 2019-04-03 DIAGNOSIS — G4730 Sleep apnea, unspecified: Secondary | ICD-10-CM | POA: Diagnosis present

## 2019-04-03 DIAGNOSIS — M48062 Spinal stenosis, lumbar region with neurogenic claudication: Principal | ICD-10-CM | POA: Diagnosis present

## 2019-04-03 DIAGNOSIS — M5116 Intervertebral disc disorders with radiculopathy, lumbar region: Secondary | ICD-10-CM | POA: Diagnosis present

## 2019-04-03 DIAGNOSIS — Z419 Encounter for procedure for purposes other than remedying health state, unspecified: Secondary | ICD-10-CM

## 2019-04-03 DIAGNOSIS — F17200 Nicotine dependence, unspecified, uncomplicated: Secondary | ICD-10-CM | POA: Diagnosis present

## 2019-04-03 DIAGNOSIS — M4316 Spondylolisthesis, lumbar region: Secondary | ICD-10-CM | POA: Diagnosis present

## 2019-04-03 DIAGNOSIS — M48061 Spinal stenosis, lumbar region without neurogenic claudication: Secondary | ICD-10-CM | POA: Diagnosis present

## 2019-04-03 HISTORY — PX: ANTERIOR LAT LUMBAR FUSION: SHX1168

## 2019-04-03 SURGERY — ANTERIOR LATERAL LUMBAR FUSION 1 LEVEL
Anesthesia: General | Site: Spine Lumbar | Laterality: Left

## 2019-04-03 MED ORDER — SODIUM CHLORIDE 0.9 % IV SOLN
INTRAVENOUS | Status: DC | PRN
  Administered 2019-04-03: 500 mL

## 2019-04-03 MED ORDER — BUPIVACAINE HCL (PF) 0.5 % IJ SOLN
INTRAMUSCULAR | Status: DC | PRN
  Administered 2019-04-03: 10 mL

## 2019-04-03 MED ORDER — ONDANSETRON HCL 4 MG/2ML IJ SOLN: 4.0000 mg | Freq: Four times a day (QID) | INTRAMUSCULAR | Status: DC | PRN

## 2019-04-03 MED ORDER — PHENYLEPHRINE HCL-NACL 10-0.9 MG/250ML-% IV SOLN
INTRAVENOUS | Status: DC | PRN
  Administered 2019-04-03: 10 ug/min via INTRAVENOUS

## 2019-04-03 MED ORDER — OXYCODONE HCL 5 MG PO TABS: 5.0000 mg | ORAL_TABLET | ORAL | Status: DC | PRN

## 2019-04-03 MED ORDER — ONDANSETRON HCL 4 MG PO TABS: 4.0000 mg | ORAL_TABLET | Freq: Four times a day (QID) | ORAL | Status: DC | PRN

## 2019-04-03 MED ORDER — SODIUM CHLORIDE 0.9 % IV SOLN: 250.0000 mL | INTRAVENOUS | Status: DC

## 2019-04-03 MED ORDER — HYDROMORPHONE HCL 1 MG/ML IJ SOLN
INTRAMUSCULAR | Status: AC
  Filled 2019-04-03: qty 0.5

## 2019-04-03 MED ORDER — MENTHOL 3 MG MT LOZG
1.0000 | LOZENGE | OROMUCOSAL | Status: DC | PRN
  Filled 2019-04-03: qty 9

## 2019-04-03 MED ORDER — PROPOFOL 10 MG/ML IV BOLUS
INTRAVENOUS | Status: AC
  Filled 2019-04-03: qty 40

## 2019-04-03 MED ORDER — MORPHINE SULFATE (PF) 2 MG/ML IV SOLN: 2.0000 mg | INTRAVENOUS | Status: DC | PRN

## 2019-04-03 MED ORDER — BISACODYL 10 MG RE SUPP: 10.0000 mg | Freq: Every day | RECTAL | Status: DC | PRN

## 2019-04-03 MED ORDER — OXYCODONE-ACETAMINOPHEN 7.5-325 MG PO TABS: 1.0000 | ORAL_TABLET | Freq: Three times a day (TID) | ORAL | Status: DC | PRN

## 2019-04-03 MED ORDER — FENTANYL CITRATE (PF) 250 MCG/5ML IJ SOLN
INTRAMUSCULAR | Status: AC
  Filled 2019-04-03: qty 5

## 2019-04-03 MED ORDER — LIDOCAINE-EPINEPHRINE 1 %-1:100000 IJ SOLN
INTRAMUSCULAR | Status: DC | PRN
  Administered 2019-04-03: 10 mL

## 2019-04-03 MED ORDER — THROMBIN 5000 UNITS EX SOLR
CUTANEOUS | Status: AC
  Filled 2019-04-03: qty 5000

## 2019-04-03 MED ORDER — 0.9 % SODIUM CHLORIDE (POUR BTL) OPTIME
TOPICAL | Status: DC | PRN
  Administered 2019-04-03: 1000 mL

## 2019-04-03 MED ORDER — LACTATED RINGERS IV SOLN
INTRAVENOUS | Status: DC
  Administered 2019-04-03 (×2): via INTRAVENOUS

## 2019-04-03 MED ORDER — KCL IN DEXTROSE-NACL 20-5-0.45 MEQ/L-%-% IV SOLN: INTRAVENOUS | Status: DC

## 2019-04-03 MED ORDER — ACETAMINOPHEN 325 MG PO TABS: 650.0000 mg | ORAL_TABLET | ORAL | Status: DC | PRN

## 2019-04-03 MED ORDER — HYDROCODONE-ACETAMINOPHEN 10-325 MG PO TABS
1.0000 | ORAL_TABLET | ORAL | Status: DC | PRN
  Administered 2019-04-03 – 2019-04-04 (×5): 2 via ORAL
  Filled 2019-04-03 (×5): qty 2

## 2019-04-03 MED ORDER — SENNOSIDES-DOCUSATE SODIUM 8.6-50 MG PO TABS: 1.0000 | ORAL_TABLET | Freq: Every evening | ORAL | Status: DC | PRN

## 2019-04-03 MED ORDER — HYDROMORPHONE HCL 1 MG/ML IJ SOLN
INTRAMUSCULAR | Status: DC | PRN
  Administered 2019-04-03: 0.5 mg via INTRAVENOUS

## 2019-04-03 MED ORDER — ALUM & MAG HYDROXIDE-SIMETH 200-200-20 MG/5ML PO SUSP: 30.0000 mL | Freq: Four times a day (QID) | ORAL | Status: DC | PRN

## 2019-04-03 MED ORDER — LIDOCAINE-EPINEPHRINE 1 %-1:100000 IJ SOLN
INTRAMUSCULAR | Status: AC
  Filled 2019-04-03: qty 1

## 2019-04-03 MED ORDER — METHOCARBAMOL 500 MG PO TABS
500.0000 mg | ORAL_TABLET | Freq: Four times a day (QID) | ORAL | Status: DC | PRN
  Administered 2019-04-03 – 2019-04-04 (×3): 500 mg via ORAL
  Filled 2019-04-03 (×3): qty 1

## 2019-04-03 MED ORDER — PANTOPRAZOLE SODIUM 40 MG PO TBEC
40.0000 mg | DELAYED_RELEASE_TABLET | Freq: Every day | ORAL | Status: DC
  Administered 2019-04-03: 40 mg via ORAL
  Filled 2019-04-03: qty 1

## 2019-04-03 MED ORDER — ACETAMINOPHEN 650 MG RE SUPP: 650.0000 mg | RECTAL | Status: DC | PRN

## 2019-04-03 MED ORDER — CHLORHEXIDINE GLUCONATE CLOTH 2 % EX PADS: 6.0000 | MEDICATED_PAD | Freq: Once | CUTANEOUS | Status: DC

## 2019-04-03 MED ORDER — FENOFIBRATE 160 MG PO TABS
160.0000 mg | ORAL_TABLET | Freq: Every day | ORAL | Status: DC
  Administered 2019-04-03 – 2019-04-04 (×2): 160 mg via ORAL
  Filled 2019-04-03 (×2): qty 1

## 2019-04-03 MED ORDER — THROMBIN 5000 UNITS EX SOLR
OROMUCOSAL | Status: DC | PRN
  Administered 2019-04-03: 5 mL

## 2019-04-03 MED ORDER — PROPOFOL 10 MG/ML IV BOLUS
INTRAVENOUS | Status: DC | PRN
  Administered 2019-04-03: 150 mg via INTRAVENOUS
  Administered 2019-04-03 (×2): 50 mg via INTRAVENOUS

## 2019-04-03 MED ORDER — DEXAMETHASONE SODIUM PHOSPHATE 10 MG/ML IJ SOLN
INTRAMUSCULAR | Status: DC | PRN
  Administered 2019-04-03: 10 mg via INTRAVENOUS

## 2019-04-03 MED ORDER — SUCCINYLCHOLINE CHLORIDE 200 MG/10ML IV SOSY
PREFILLED_SYRINGE | INTRAVENOUS | Status: DC | PRN
  Administered 2019-04-03: 120 mg via INTRAVENOUS

## 2019-04-03 MED ORDER — GLYCOPYRROLATE PF 0.2 MG/ML IJ SOSY
PREFILLED_SYRINGE | INTRAMUSCULAR | Status: AC
  Filled 2019-04-03: qty 1

## 2019-04-03 MED ORDER — OXYCODONE-ACETAMINOPHEN 7.5-325 MG PO TABS: 1.0000 | ORAL_TABLET | ORAL | Status: DC | PRN

## 2019-04-03 MED ORDER — METHOCARBAMOL 1000 MG/10ML IJ SOLN
500.0000 mg | Freq: Four times a day (QID) | INTRAVENOUS | Status: DC | PRN
  Filled 2019-04-03: qty 5

## 2019-04-03 MED ORDER — CITALOPRAM HYDROBROMIDE 20 MG PO TABS
20.0000 mg | ORAL_TABLET | Freq: Every day | ORAL | Status: DC
  Administered 2019-04-04: 20 mg via ORAL
  Filled 2019-04-03: qty 1

## 2019-04-03 MED ORDER — DOCUSATE SODIUM 100 MG PO CAPS
100.0000 mg | ORAL_CAPSULE | Freq: Two times a day (BID) | ORAL | Status: DC
  Administered 2019-04-03 – 2019-04-04 (×2): 100 mg via ORAL
  Filled 2019-04-03 (×2): qty 1

## 2019-04-03 MED ORDER — HYDROMORPHONE HCL 1 MG/ML IJ SOLN
0.2500 mg | INTRAMUSCULAR | Status: DC | PRN
  Administered 2019-04-03 (×2): 0.5 mg via INTRAVENOUS

## 2019-04-03 MED ORDER — LIDOCAINE 2% (20 MG/ML) 5 ML SYRINGE
INTRAMUSCULAR | Status: DC | PRN
  Administered 2019-04-03: 100 mg via INTRAVENOUS

## 2019-04-03 MED ORDER — PANTOPRAZOLE SODIUM 40 MG IV SOLR: 40.0000 mg | Freq: Every day | INTRAVENOUS | Status: DC

## 2019-04-03 MED ORDER — FENTANYL CITRATE (PF) 250 MCG/5ML IJ SOLN
INTRAMUSCULAR | Status: DC | PRN
  Administered 2019-04-03 (×4): 50 ug via INTRAVENOUS
  Administered 2019-04-03: 100 ug via INTRAVENOUS
  Administered 2019-04-03 (×3): 50 ug via INTRAVENOUS

## 2019-04-03 MED ORDER — BUPIVACAINE HCL (PF) 0.5 % IJ SOLN
INTRAMUSCULAR | Status: AC
  Filled 2019-04-03: qty 30

## 2019-04-03 MED ORDER — ONDANSETRON HCL 4 MG/2ML IJ SOLN
INTRAMUSCULAR | Status: DC | PRN
  Administered 2019-04-03: 4 mg via INTRAVENOUS

## 2019-04-03 MED ORDER — CEFAZOLIN SODIUM-DEXTROSE 2-4 GM/100ML-% IV SOLN
2.0000 g | Freq: Three times a day (TID) | INTRAVENOUS | Status: AC
  Administered 2019-04-03 – 2019-04-04 (×2): 2 g via INTRAVENOUS
  Filled 2019-04-03 (×2): qty 100

## 2019-04-03 MED ORDER — GLYCOPYRROLATE PF 0.2 MG/ML IJ SOSY
PREFILLED_SYRINGE | INTRAMUSCULAR | Status: DC | PRN
  Administered 2019-04-03: .2 mg via INTRAVENOUS

## 2019-04-03 MED ORDER — SODIUM CHLORIDE 0.9% FLUSH: 3.0000 mL | Freq: Two times a day (BID) | INTRAVENOUS | Status: DC

## 2019-04-03 MED ORDER — PROMETHAZINE HCL 25 MG/ML IJ SOLN: 6.2500 mg | INTRAMUSCULAR | Status: DC | PRN

## 2019-04-03 MED ORDER — ZOLPIDEM TARTRATE 5 MG PO TABS: 5.0000 mg | ORAL_TABLET | Freq: Every evening | ORAL | Status: DC | PRN

## 2019-04-03 MED ORDER — MIDAZOLAM HCL 2 MG/2ML IJ SOLN
INTRAMUSCULAR | Status: DC | PRN
  Administered 2019-04-03: 2 mg via INTRAVENOUS

## 2019-04-03 MED ORDER — SODIUM CHLORIDE 0.9% FLUSH: 3.0000 mL | INTRAVENOUS | Status: DC | PRN

## 2019-04-03 MED ORDER — MIDAZOLAM HCL 2 MG/2ML IJ SOLN
INTRAMUSCULAR | Status: AC
  Filled 2019-04-03: qty 2

## 2019-04-03 MED ORDER — EPHEDRINE SULFATE-NACL 50-0.9 MG/10ML-% IV SOSY
PREFILLED_SYRINGE | INTRAVENOUS | Status: DC | PRN
  Administered 2019-04-03 (×4): 10 mg via INTRAVENOUS

## 2019-04-03 MED ORDER — HYDROMORPHONE HCL 1 MG/ML IJ SOLN
INTRAMUSCULAR | Status: AC
  Filled 2019-04-03: qty 1

## 2019-04-03 MED ORDER — PHENOL 1.4 % MT LIQD: 1.0000 | OROMUCOSAL | Status: DC | PRN

## 2019-04-03 MED ORDER — HYDROCODONE-ACETAMINOPHEN 10-325 MG PO TABS: 2.0000 | ORAL_TABLET | ORAL | Status: DC | PRN

## 2019-04-03 MED ORDER — FLEET ENEMA 7-19 GM/118ML RE ENEM: 1.0000 | ENEMA | Freq: Once | RECTAL | Status: DC | PRN

## 2019-04-03 SURGICAL SUPPLY — 54 items
ADH SKN CLS APL DERMABOND .7 (GAUZE/BANDAGES/DRESSINGS) ×1
BLADE CLIPPER SURG (BLADE) IMPLANT
BOLT PLATE XLIF 5.5X55 LRG (Bolt) ×2 IMPLANT
BOLT PLATE XLIF 5.5X55MM LRG (Bolt) ×2 IMPLANT
CAGE COROENT XL WIDE14X22X55 (Cage) ×2 IMPLANT
CARTRIDGE OIL MAESTRO DRILL (MISCELLANEOUS) ×1 IMPLANT
DECANTER SPIKE VIAL GLASS SM (MISCELLANEOUS) ×3 IMPLANT
DERMABOND ADVANCED (GAUZE/BANDAGES/DRESSINGS) ×2
DERMABOND ADVANCED .7 DNX12 (GAUZE/BANDAGES/DRESSINGS) ×2 IMPLANT
DIFFUSER DRILL AIR PNEUMATIC (MISCELLANEOUS) ×3 IMPLANT
DRAPE C-ARM 42X72 X-RAY (DRAPES) ×3 IMPLANT
DRAPE C-ARMOR (DRAPES) ×3 IMPLANT
DRAPE LAPAROTOMY 100X72X124 (DRAPES) ×3 IMPLANT
DRSG OPSITE POSTOP 3X4 (GAUZE/BANDAGES/DRESSINGS) ×4 IMPLANT
DURAPREP 26ML APPLICATOR (WOUND CARE) ×3 IMPLANT
ELECT REM PT RETURN 9FT ADLT (ELECTROSURGICAL) ×3
ELECTRODE REM PT RTRN 9FT ADLT (ELECTROSURGICAL) ×1 IMPLANT
GAUZE 4X4 16PLY RFD (DISPOSABLE) IMPLANT
GLOVE BIO SURGEON STRL SZ8 (GLOVE) ×3 IMPLANT
GLOVE BIOGEL PI IND STRL 8 (GLOVE) ×1 IMPLANT
GLOVE BIOGEL PI IND STRL 8.5 (GLOVE) ×1 IMPLANT
GLOVE BIOGEL PI INDICATOR 8 (GLOVE) ×2
GLOVE BIOGEL PI INDICATOR 8.5 (GLOVE) ×2
GLOVE ECLIPSE 8.0 STRL XLNG CF (GLOVE) ×3 IMPLANT
GOWN STRL REUS W/ TWL LRG LVL3 (GOWN DISPOSABLE) IMPLANT
GOWN STRL REUS W/ TWL XL LVL3 (GOWN DISPOSABLE) ×2 IMPLANT
GOWN STRL REUS W/TWL 2XL LVL3 (GOWN DISPOSABLE) ×2 IMPLANT
GOWN STRL REUS W/TWL LRG LVL3 (GOWN DISPOSABLE) ×9
GOWN STRL REUS W/TWL XL LVL3 (GOWN DISPOSABLE) ×6
KIT BASIN OR (CUSTOM PROCEDURE TRAY) ×3 IMPLANT
KIT DILATOR XLIF 5 (KITS) ×1 IMPLANT
KIT INFUSE XX SMALL 0.7CC (Orthopedic Implant) ×2 IMPLANT
KIT SURGICAL ACCESS MAXCESS 4 (KITS) ×2 IMPLANT
KIT TURNOVER KIT B (KITS) ×3 IMPLANT
KIT XLIF (KITS) ×1
MODULE NVM5 NEXT GEN EMG (NEEDLE) ×2 IMPLANT
NDL HYPO 25X1 1.5 SAFETY (NEEDLE) ×1 IMPLANT
NEEDLE HYPO 25X1 1.5 SAFETY (NEEDLE) ×3 IMPLANT
NS IRRIG 1000ML POUR BTL (IV SOLUTION) ×3 IMPLANT
OIL CARTRIDGE MAESTRO DRILL (MISCELLANEOUS) ×3
PACK LAMINECTOMY NEURO (CUSTOM PROCEDURE TRAY) ×3 IMPLANT
PLATE DECADE XLIF 2H SZ14 (Plate) ×3 IMPLANT
PUTTY BONE ATTRAX 5CC STRIP (Putty) ×4 IMPLANT
SPONGE LAP 4X18 RFD (DISPOSABLE) IMPLANT
SPONGE SURGIFOAM ABS GEL SZ50 (HEMOSTASIS) IMPLANT
STAPLER SKIN PROX WIDE 3.9 (STAPLE) ×3 IMPLANT
SUT VIC AB 1 CT1 18XBRD ANBCTR (SUTURE) ×1 IMPLANT
SUT VIC AB 1 CT1 8-18 (SUTURE) ×3
SUT VIC AB 2-0 CT1 18 (SUTURE) ×3 IMPLANT
SUT VIC AB 3-0 SH 8-18 (SUTURE) ×3 IMPLANT
TOWEL GREEN STERILE (TOWEL DISPOSABLE) IMPLANT
TOWEL GREEN STERILE FF (TOWEL DISPOSABLE) IMPLANT
TRAY FOLEY MTR SLVR 16FR STAT (SET/KITS/TRAYS/PACK) ×3 IMPLANT
WATER STERILE IRR 1000ML POUR (IV SOLUTION) ×3 IMPLANT

## 2019-04-03 NOTE — Progress Notes (Addendum)
Patient ID: Brian Franco, male   DOB: 1955/06/21, 63 y.o.   MRN: [id number] Alert, conversant. Reports mild pain left side. No buttock or leg pain. No back pain at present. Good strength BLE.  Patient is doing well.

## 2019-04-03 NOTE — Transfer of Care (Signed)
Immediate Anesthesia Transfer of Care Note  Patient: Brian Franco  Procedure(s) Performed: Left Lumbar Three-Four Anterolateral interbody fusion with lateral plate (Left Spine Lumbar)  Patient Location: PACU  Anesthesia Type:General  Level of Consciousness: patient cooperative and responds to stimulation  Airway & Oxygen Therapy: Patient Spontanous Breathing and Patient connected to nasal cannula oxygen  Post-op Assessment: Report given to RN, Post -op Vital signs reviewed and stable and Patient moving all extremities X 4  Post vital signs: Reviewed and stable  Last Vitals:  Vitals Value Taken Time  BP 170/83 04/03/19 1257  Temp 36.7 C 04/03/19 1254  Pulse 73 04/03/19 1300  Resp 18 04/03/19 1300  SpO2 89 % 04/03/19 1300  Vitals shown include unvalidated device data.  Last Pain:  Vitals:   04/03/19 1254  TempSrc:   PainSc: Asleep      Patients Stated Pain Goal: 3 (65/[id number]/46 [id number])  Complications: No apparent anesthesia complications

## 2019-04-03 NOTE — Anesthesia Postprocedure Evaluation (Signed)
Anesthesia Post Note  Patient: Brian Franco  Procedure(s) Performed: Left Lumbar Three-Four Anterolateral interbody fusion with lateral plate (Left Spine Lumbar)     Patient location during evaluation: PACU Anesthesia Type: General Level of consciousness: awake and alert Pain management: pain level controlled Vital Signs Assessment: post-procedure vital signs reviewed and stable Respiratory status: spontaneous breathing, nonlabored ventilation, respiratory function stable and patient connected to nasal cannula oxygen Cardiovascular status: blood pressure returned to baseline and stable Postop Assessment: no apparent nausea or vomiting Anesthetic complications: no    Last Vitals:  Vitals:   04/03/19 1404 04/03/19 1657  BP: (!) 165/89 (!) 143/79  Pulse: 61 67  Resp: 18 16  Temp: 36.7 C 36.6 C  SpO2: 93% 98%    Last Pain:  Vitals:   04/03/19 1657  TempSrc: Oral  PainSc:                  [name]

## 2019-04-03 NOTE — Anesthesia Preprocedure Evaluation (Signed)
Anesthesia Evaluation  Patient identified by MRN, date of birth, ID band Patient awake    Reviewed: Allergy & Precautions, NPO status , Patient's Chart, lab work & pertinent test results  Airway Mallampati: II  TM Distance: >3 FB Neck ROM: Full    Dental no notable dental hx.    Pulmonary sleep apnea and Continuous Positive Airway Pressure Ventilation , Current Smoker,    Pulmonary exam normal breath sounds clear to auscultation       Cardiovascular negative cardio ROS Normal cardiovascular exam Rhythm:Regular Rate:Normal     Neuro/Psych negative neurological ROS  negative psych ROS   GI/Hepatic negative GI ROS, Neg liver ROS,   Endo/Other  negative endocrine ROS  Renal/GU negative Renal ROS  negative genitourinary   Musculoskeletal negative musculoskeletal ROS (+)   Abdominal   Peds negative pediatric ROS (+)  Hematology negative hematology ROS (+)   Anesthesia Other Findings   Reproductive/Obstetrics negative OB ROS                            Anesthesia Physical Anesthesia Plan  ASA: III  Anesthesia Plan: General   Post-op Pain Management:    Induction: Intravenous  PONV Risk Score and Plan: 2 and Ondansetron, Dexamethasone and Treatment may vary due to age or medical condition  Airway Management Planned: Oral ETT  Additional Equipment:   Intra-op Plan:   Post-operative Plan: Extubation in OR  Informed Consent: I have reviewed the patients History and Physical, chart, labs and discussed the procedure including the risks, benefits and alternatives for the proposed anesthesia with the patient or authorized representative who has indicated his/her understanding and acceptance.     Dental advisory given  Plan Discussed with: CRNA and Surgeon  Anesthesia Plan Comments:         Anesthesia Quick Evaluation

## 2019-04-03 NOTE — Op Note (Signed)
04/03/2019  12:56 PM  PATIENT:  Brian Franco  63 y.o. male  PRE-OPERATIVE DIAGNOSIS:  Spinal stenosis, Lumbar region with neurogenic claudication, lumbar foraminal stenosis, spondylolisthesis, radiculopathy, lumbago L 34 level  POST-OPERATIVE DIAGNOSIS:  Spinal stenosis, Lumbar region with neurogenic claudication, lumbar foraminal stenosis, spondylolisthesis, radiculopathy, lumbago L 34 level  PROCEDURE:  Procedure(s) with comments: Left Lumbar Three-Four Anterolateral interbody fusion with lateral plate (Left) - Left Lumbar Three-Four Anterolateral interbody fusion with lateral plate   SURGEON:  Surgeon(s) and Role:    * Sharetha Newson, MD - Primary  PHYSICIAN ASSISTANT:   ASSISTANTS: Poteta, RN   ANESTHESIA:   general  EBL:  50 mL   BLOOD ADMINISTERED:none  DRAINS: none   LOCAL MEDICATIONS USED:  MARCAINE    and LIDOCAINE   SPECIMEN:  No Specimen  DISPOSITION OF SPECIMEN:  N/A  COUNTS:  YES  TOURNIQUET:  * No tourniquets in log *  DICTATION: Patient is a 63-year-old with 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 It was elected to take him to surgery for anterolateral decompression and lateral plate fixation at the L 34 level.  He had previously undergone fusion L 4 - S 1 levels.  Procedure: Patient was brought to the operating room and placed in a right lateral decubitus position on the operative table and using orthogonally projected C-arm fluoroscopy the patient was placed so that the L 34  level was visualized in AP and lateral plane. The patient was then taped into position. The table was flexed so as to expose the L 34 level. Skin was marked along with a posterior finger dissection incision. His flank was then prepped and draped in usual sterile fashion and incisions were made. Posterior finger dissection was made to enter the retroperitoneal space and then subsequently the probe was inserted into the psoas muscle from the left side  initially at the L 34 level. After mapping the neural elements were able to dock the probe per the midpoint of this vertebral level and without indications electrically of too close proximity to the neural tissues. Subsequently the self-retaining tractor was.after sequential dilators were utilized the shim was employed and the interspace was cleared of psoas muscle and then incised. A thorough discectomy was performed. Instruments were used to clear the interspace of disc material. After thorough discectomy was performed and this was performed using AP and lateral fluoroscopy a 14 lordotic x 55 x 22 mm PEEK implant was packed with extra extra small BMP and Attrax. This was tamped into position using the slides and its position was confirmed on AP and lateral fluoroscopy. A Decade lateral plate (size 14) was affixed to the lateral spine with 5.5 x 55 mm screws, one in L 3 and one in L 4.  All screws were torque locked and positioning was confirmed with AP and lateral fluoroscopy. Hemostasis was assured the wounds were irrigated interrupted Vicryl sutures.Sterile occlusive dressing was placed with Dermabond and occlusive dressings. The patient was then extubated in the operating room and taken to recovery in stable and satisfactory condition having tolerated his operation well. Counts were correct at the end of the case.  PLAN OF CARE: Admit to inpatient   PATIENT DISPOSITION:  PACU - hemodynamically stable.   Delay start of Pharmacological VTE agent (>24hrs) due to surgical blood loss or risk of bleeding: yes  

## 2019-04-03 NOTE — Brief Op Note (Signed)
04/03/2019  12:56 PM  PATIENT:  Brian Franco  63 y.o. male  PRE-OPERATIVE DIAGNOSIS:  Spinal stenosis, Lumbar region with neurogenic claudication, lumbar foraminal stenosis, spondylolisthesis, radiculopathy, lumbago L 34 level  POST-OPERATIVE DIAGNOSIS:  Spinal stenosis, Lumbar region with neurogenic claudication, lumbar foraminal stenosis, spondylolisthesis, radiculopathy, lumbago L 34 level  PROCEDURE:  Procedure(s) with comments: Left Lumbar Three-Four Anterolateral interbody fusion with lateral plate (Left) - Left Lumbar Three-Four Anterolateral interbody fusion with lateral plate   SURGEON:  Surgeon(s) and Role:    Erline Levine, MD - Primary  PHYSICIAN ASSISTANT:   ASSISTANTS: Poteta, RN   ANESTHESIA:   general  EBL:  50 mL   BLOOD ADMINISTERED:none  DRAINS: none   LOCAL MEDICATIONS USED:  MARCAINE    and LIDOCAINE   SPECIMEN:  No Specimen  DISPOSITION OF SPECIMEN:  N/A  COUNTS:  YES  TOURNIQUET:  * No tourniquets in log *  DICTATION: Patient is a 63 year old with 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 It was elected to take him to surgery for anterolateral decompression and lateral plate fixation at the L 34 level.  He had previously undergone fusion L 4 - S 1 levels.  Procedure: Patient was brought to the operating room and placed in a right lateral decubitus position on the operative table and using orthogonally projected C-arm fluoroscopy the patient was placed so that the L 34  level was visualized in AP and lateral plane. The patient was then taped into position. The table was flexed so as to expose the L 34 level. Skin was marked along with a posterior finger dissection incision. His flank was then prepped and draped in usual sterile fashion and incisions were made. Posterior finger dissection was made to enter the retroperitoneal space and then subsequently the probe was inserted into the psoas muscle from the left side  initially at the L 34 level. After mapping the neural elements were able to dock the probe per the midpoint of this vertebral level and without indications electrically of too close proximity to the neural tissues. Subsequently the self-retaining tractor was.after sequential dilators were utilized the shim was employed and the interspace was cleared of psoas muscle and then incised. A thorough discectomy was performed. Instruments were used to clear the interspace of disc material. After thorough discectomy was performed and this was performed using AP and lateral fluoroscopy a 14 lordotic x 55 x 22 mm PEEK implant was packed with extra extra small BMP and Attrax. This was tamped into position using the slides and its position was confirmed on AP and lateral fluoroscopy. A Decade lateral plate (size 14) was affixed to the lateral spine with 5.5 x 55 mm screws, one in L 3 and one in L 4.  All screws were torque locked and positioning was confirmed with AP and lateral fluoroscopy. Hemostasis was assured the wounds were irrigated interrupted Vicryl sutures.Sterile occlusive dressing was placed with Dermabond and occlusive dressings. The patient was then extubated in the operating room and taken to recovery in stable and satisfactory condition having tolerated his operation well. Counts were correct at the end of the case.  PLAN OF CARE: Admit to inpatient   PATIENT DISPOSITION:  PACU - hemodynamically stable.   Delay start of Pharmacological VTE agent (>24hrs) due to surgical blood loss or risk of bleeding: yes

## 2019-04-03 NOTE — Progress Notes (Signed)
Awake, alert, conversant.  Full strength both lower extremities.  Minimal pain.  Doing well.

## 2019-04-03 NOTE — Interval H&P Note (Signed)
History and Physical Interval Note:  04/03/2019 10:34 AM  Brian Franco  has presented today for surgery, with the diagnosis of Spinal stenosis, Lumbar region with neurogenic claudication.  The various methods of treatment have been discussed with the patient and family. After consideration of risks, benefits and other options for treatment, the patient has consented to  Procedure(s) with comments: Left Lumbar Three-Four Anterolateral interbody fusion with lateral plate (Left) - Left Lumbar Three-Four Anterolateral interbody fusion with lateral plate as a surgical intervention.  The patient's history has been reviewed, patient examined, no change in status, stable for surgery.  I have reviewed the patient's chart and labs.  Questions were answered to the patient's satisfaction.     Peggyann Shoals

## 2019-04-04 ENCOUNTER — Encounter (HOSPITAL_COMMUNITY): Payer: Self-pay | Admitting: Neurosurgery

## 2019-04-04 MED ORDER — METHOCARBAMOL 500 MG PO TABS: 500.0000 mg | ORAL_TABLET | Freq: Four times a day (QID) | ORAL | 1 refills | Status: DC | PRN

## 2019-04-04 MED ORDER — HYDROCODONE-ACETAMINOPHEN 10-325 MG PO TABS: 1.0000 | ORAL_TABLET | ORAL | 0 refills | Status: DC | PRN

## 2019-04-04 NOTE — Discharge Summary (Signed)
Physician Discharge Summary  Patient ID: Brian Franco MRN: [id number] DOB/AGE: June 18, 1955 63 y.o.  Admit date: 04/03/2019 Discharge date: 04/04/2019  Admission Diagnoses: Spinal stenosis, Lumbar region with neurogenic claudication, lumbar foraminal stenosis, spondylolisthesis, radiculopathy, lumbago L 34 level    Discharge Diagnoses: Spinal stenosis, Lumbar region with neurogenic claudication, lumbar foraminal stenosis, spondylolisthesis, radiculopathy, lumbago L 34 level s/p Left Lumbar Three-Four Anterolateral interbody fusion with lateral plate (Left) - Left Lumbar Three-Four Anterolateral interbody fusion with lateral plate      Active Problems:   Degenerative lumbar spinal stenosis   Discharged Condition: good  Hospital Course:  Brian Franco was admitted for  Surgery with diagnosis of spinal stenosis and radiculopathy.  Following uncomplicated left H9-6 XLIF with lateral plate, he recovered nicely and transferred to 3 central for nursing care and therapies.  He is mobilizing well.  Consults: None  Significant Diagnostic Studies: radiology: X-Ray: Intraoperative  Treatments: surgery: Left Lumbar Three-Four Anterolateral interbody fusion with lateral plate (Left) - Left Lumbar Three-Four Anterolateral interbody fusion with lateral plate     Discharge Exam: Blood pressure 137/68, pulse (!) 53, temperature 98.7 F (37.1 C), temperature source Oral, resp. rate 16, height 6\' 4"  (1.93 m), weight 119.8 kg, SpO2 99 %. Alert, conversant, sitting on edge of bed. Reports only mild left side incisional discomfort, occassional left thigh pain discomfort with ambulation. Incisions without erythema swelling or drainage beneath honeycomb drsg and Dermabond. Good strength bilateral lower extremities.     Disposition:Discharge to home. Patient verbalizes understanding of discharge instructions and agrees to call the office to schedule his 3-4 week postop visit.  Prescriptions for Percocet 7.5/325 and Robaxin 500 mg will be E prescribed to patient's pharmacy for p.r.n. home use.      Discharge Instructions    Diet - low sodium heart healthy   Complete by: As directed    Increase activity slowly   Complete by: As directed      Allergies as of 04/04/2019   No Known Allergies     Medication List    TAKE these medications   CeleXA 20 MG tablet Generic drug: citalopram Take 20 mg by mouth daily.   fenofibrate 160 MG tablet Take 160 mg by mouth daily.   HYDROcodone-acetaminophen 10-325 MG tablet Commonly known as: NORCO Take 1-2 tablets by mouth every 4 (four) hours as needed for moderate pain ((score 7 to 10)).   methocarbamol 500 MG tablet Commonly known as: ROBAXIN Take 1 tablet (500 mg total) by mouth every 6 (six) hours as needed for muscle spasms.   Percocet 7.5-325 MG tablet Generic drug: oxyCODONE-acetaminophen Take 1 tablet by mouth 3 (three) times daily as needed (severe pain).        Signed: Peggyann Shoals, MD 04/04/2019, 8:18 AM

## 2019-04-04 NOTE — Discharge Instructions (Signed)

## 2019-04-04 NOTE — Evaluation (Signed)
Occupational Therapy Evaluation Patient Details Name: CECILIO Franco MRN: [id number] DOB: November 09, 1955 Today's Date: 04/04/2019    History of Present Illness Pt is a 63 yo male s/p L3-4 ALIF. Pmhx: sleep apnea   Clinical Impression   Pt PTA: Pt independent. Pt currently using figure 4 technique for LB ADL; pt given handout for back precautions and for UB/ADL ADL. Pt education for grooming with 2 cups to avoid bending. Pt applying brace in standing position. Back handout provided and reviewed adls in detail. Pt educated on: clothing between brace, never sleep in brace, set an alarm at night for medication, avoid sitting for long periods of time, correct bed positioning for sleeping, correct sequence for bed mobility, avoiding lifting more than 5 pounds and never wash directly over incision. All education is complete and patient indicates understanding. Pt does not require continued OT skilled services. OT signing off.    Follow Up Recommendations  No OT follow up    Equipment Recommendations  None recommended by OT    Recommendations for Other Services       Precautions / Restrictions Precautions Precautions: Back Precaution Booklet Issued: Yes (comment) Precaution Comments: verbal discussion of back precautions Required Braces or Orthoses: Spinal Brace Spinal Brace: Lumbar corset;Applied in standing position Restrictions Weight Bearing Restrictions: No      Mobility Bed Mobility Overal bed mobility: Modified Independent             General bed mobility comments: verbal discussion of log roll; pt seated EOB.  Transfers Overall transfer level: Modified independent Equipment used: None                  Balance Overall balance assessment: Modified Independent                                         ADL either performed or assessed with clinical judgement   ADL Overall ADL's : At baseline;Modified independent                                        General ADL Comments: Pt using figure 4 technique for LB ADL; pt given handout for back precautions and for UB/ADL ADL. Pt education for grooming with 2 cups to avoid bending. Pt applying brace in standing position.     Vision Baseline Vision/History: No visual deficits Vision Assessment?: No apparent visual deficits     Perception     Praxis      Pertinent Vitals/Pain Pain Assessment: 0-10 Pain Score: 2  Pain Location: low back Pain Descriptors / Indicators: Discomfort Pain Intervention(s): Premedicated before session;Monitored during session     Hand Dominance Right   Extremity/Trunk Assessment Upper Extremity Assessment Upper Extremity Assessment: Overall WFL for tasks assessed   Lower Extremity Assessment Lower Extremity Assessment: Generalized weakness   Cervical / Trunk Assessment Cervical / Trunk Assessment: Other exceptions Cervical / Trunk Exceptions: s/p lumbar sx   Communication Communication Communication: No difficulties   Cognition Arousal/Alertness: Awake/alert Behavior During Therapy: WFL for tasks assessed/performed Overall Cognitive Status: Within Functional Limits for tasks assessed                                     General Comments  Exercises     Shoulder Instructions      Home Living Family/patient expects to be discharged to:: Private residence Living Arrangements: Spouse/significant other Available Help at Discharge: Family;Available 24 hours/day Type of Home: House Home Access: Stairs to enter [employer] of Steps: 1   Home Layout: One level     Bathroom Shower/Tub: Producer, television/film/video: Standard     Home Equipment: Bedside commode;Shower seat - built in          Prior Functioning/Environment Level of Independence: Independent                 OT Problem List: Decreased activity tolerance      OT Treatment/Interventions:      OT Goals(Current  goals can be found in the care plan section) Acute Rehab OT Goals Patient Stated Goal: to go home  OT Frequency:     Barriers to D/C:            Co-evaluation              AM-PAC OT "6 Clicks" Daily Activity     Outcome Measure Help from another person eating meals?: None Help from another person taking care of personal grooming?: None Help from another person toileting, which includes using toliet, bedpan, or urinal?: None Help from another person bathing (including washing, rinsing, drying)?: A Little Help from another person to put on and taking off regular upper body clothing?: None Help from another person to put on and taking off regular lower body clothing?: None 6 Click Score: 23   End of Session Equipment Utilized During Treatment: Back brace Nurse Communication: Mobility status  Activity Tolerance: Patient tolerated treatment well Patient left: Other (comment)(with Physical therapy)  OT Visit Diagnosis: Muscle weakness (generalized) (M62.81)                Time: 2620-3559 OT Time Calculation (min): 13 min Charges:  OT General Charges $OT Visit: 1 Visit OT Evaluation $OT Eval Low Complexity: 1 Low  Brian Franco) Brian Franco OTR/L Acute Rehabilitation Services Pager: [phone] Office: [phone]   Brian Franco 04/04/2019, 7:48 AM

## 2019-04-04 NOTE — Progress Notes (Addendum)
Subjective: Patient reports "I'm doing pretty good"  Objective: Vital signs in last 24 hours: Temp:  [97.8 F (36.6 C)-98.7 F (37.1 C)] 98.7 F (37.1 C) (11/25 0732) Pulse Rate:  [51-76] 53 (11/25 0732) Resp:  [16-20] 16 (11/25 0732) BP: (108-170)/(66-90) 137/68 (11/25 0732) SpO2:  [93 %-99 %] 99 % (11/25 0732) Weight:  [119.8 kg] 119.8 kg (11/24 1009)  Intake/Output from previous day: 11/24 0701 - 11/25 0700 In: 1100 [I.V.:1100] Out: 500 [Urine:450; Blood:50] Intake/Output this shift: No intake/output data recorded.  Alert, conversant, sitting on edge of bed. Reports only mild left side incisional discomfort, occassional left thigh pain discomfort with ambulation. Incisions without erythema swelling or drainage beneath honeycomb drsg and Dermabond.   Good strength  bilateral lower extremities.   Lab Results: No results for input(s): WBC, HGB, HCT, PLT in the last 72 hours. BMET No results for input(s): NA, K, CL, CO2, GLUCOSE, BUN, CREATININE, CALCIUM in the last 72 hours.  Studies/Results: Dg Lumbar Spine 2-3 Views  Result Date: 04/03/2019 CLINICAL DATA:  XLIF, left lumbar 3-4 anterolateral interbody fusion with lateral plate RSTO. EXAM: LUMBAR SPINE - 2-3 VIEW; DG C-ARM 1-60 MIN COMPARISON:  MRI lumbar spine 02/21/2019 FINDINGS: Two intraoperative images are submitted. Lateral fixation hardware at the reported L3-L4 level. The L3 vertebral body screw is slightly proud to bone distally. The L4 screw tip is obscured by the superimposed posterior spinal fusion construct. Partially visualized posterior spinal fusion construct spanning L5-S1 with interbody spacers at these levels. IMPRESSION: Two intraoperative images of the lumbar spine as detailed. Electronically Signed   By: Kellie Simmering DO   On: 04/03/2019 13:14   Dg C-arm 1-60 Min  Result Date: 04/03/2019 CLINICAL DATA:  XLIF, left lumbar 3-4 anterolateral interbody fusion with lateral plate RSTO. EXAM: LUMBAR SPINE - 2-3  VIEW; DG C-ARM 1-60 MIN COMPARISON:  MRI lumbar spine 02/21/2019 FINDINGS: Two intraoperative images are submitted. Lateral fixation hardware at the reported L3-L4 level. The L3 vertebral body screw is slightly proud to bone distally. The L4 screw tip is obscured by the superimposed posterior spinal fusion construct. Partially visualized posterior spinal fusion construct spanning L5-S1 with interbody spacers at these levels. IMPRESSION: Two intraoperative images of the lumbar spine as detailed. Electronically Signed   By: Kellie Simmering DO   On: 04/03/2019 13:14    Assessment/Plan: Improving  LOS: 1 day  Per Dr. Vertell Limber, discharge to home.  patient verbalizes understanding of discharge instructions and agrees to call the office to schedule his 3-4 week postop visit.  Prescriptions for Percocet 7.5/325 and Robaxin 500 mg will be E prescribed to patient's pharmacy for p.r.n. home use.    Verdis Prime 04/04/2019, 8:03 AM   Patient is doing well.  Discharge home.

## 2019-04-04 NOTE — Evaluation (Signed)
Physical Therapy Evaluation & Discharge Patient Details Name: Brian Franco MRN: [id number] DOB: 1955-06-02 Today's Date: 04/04/2019   History of Present Illness  Pt is a 63 yo male s/p L3-4 ALIF on 04/02/19. PMH includes OSA, previous lumbar sx.  Clinical Impression  Patient evaluated by Physical Therapy with no further acute PT needs identified. PTA, pt independent, works and lives with wife. Today, pt independent with transfers, ambulation and stair training. Reviewed educ re: brace application, back precautions, therex/positioning, importance of mobility. All education has been completed and the patient has no further questions. Acute PT is signing off. Thank you for this referral.    Follow Up Recommendations No PT follow up    Equipment Recommendations  None recommended by PT    Recommendations for Other Services       Precautions / Restrictions Precautions Precautions: Back Precaution Booklet Issued: Yes (comment) Precaution Comments: verbal discussion of back precautions Required Braces or Orthoses: Spinal Brace Spinal Brace: Lumbar corset;Applied in sitting position Restrictions Weight Bearing Restrictions: No      Mobility  Bed Mobility Overal bed mobility: Modified Independent             General bed mobility comments: Received standing in room with OT  Transfers Overall transfer level: Independent Equipment used: None                Ambulation/Gait Ambulation/Gait assistance: Independent Gait Distance (Feet): 500 Feet Assistive device: None Gait Pattern/deviations: Step-through pattern;Decreased stride length   Gait velocity interpretation: 1.31 - 2.62 ft/sec, indicative of limited community ambulator General Gait Details: Slow, guarded gait indep without DME  Stairs Stairs: Yes Stairs assistance: Modified independent (Device/Increase time) Stair Management: One rail Right;Alternating pattern;Forwards Number of Stairs: 11     Wheelchair Mobility    Modified Rankin (Stroke Patients Only)       Balance Overall balance assessment: Needs assistance   Sitting balance-Leahy Scale: Good       Standing balance-Leahy Scale: Good               High level balance activites: Side stepping;Backward walking;Direction changes;Turns;Sudden stops;Head turns High Level Balance Comments: No overt instability or LOB with higher level balance tasks             Pertinent Vitals/Pain Pain Assessment: Faces Pain Score: 2  Faces Pain Scale: Hurts a little bit Pain Location: low back Pain Descriptors / Indicators: Discomfort Pain Intervention(s): Monitored during session    Home Living Family/patient expects to be discharged to:: Private residence Living Arrangements: Spouse/significant other Available Help at Discharge: Family;Available 24 hours/day Type of Home: House Home Access: Stairs to enter   [employer] of Steps: 1 Home Layout: One level Home Equipment: Bedside commode;Shower seat - built in      Prior Function Level of Independence: Independent         Comments: Works as Consulting civil engineer, on his feet a lot, plans to take 3 more weeks off     Hand Dominance   Dominant Hand: Right    Extremity/Trunk Assessment   Upper Extremity Assessment Upper Extremity Assessment: Overall WFL for tasks assessed    Lower Extremity Assessment Lower Extremity Assessment: Overall WFL for tasks assessed    Cervical / Trunk Assessment Cervical / Trunk Assessment: Other exceptions Cervical / Trunk Exceptions: s/p lumbar sx  Communication   Communication: No difficulties  Cognition Arousal/Alertness: Awake/alert Behavior During Therapy: WFL for tasks assessed/performed Overall Cognitive Status: Within Functional Limits for tasks assessed  General Comments      Exercises     Assessment/Plan    PT  Assessment Patent does not need any further PT services  PT Problem List         PT Treatment Interventions      PT Goals (Current goals can be found in the Care Plan section)  Acute Rehab PT Goals Patient Stated Goal: to go home PT Goal Formulation: All assessment and education complete, DC therapy    Frequency     Barriers to discharge        Co-evaluation               AM-PAC PT "6 Clicks" Mobility  Outcome Measure Help needed turning from your back to your side while in a flat bed without using bedrails?: None Help needed moving from lying on your back to sitting on the side of a flat bed without using bedrails?: None Help needed moving to and from a bed to a chair (including a wheelchair)?: None Help needed standing up from a chair using your arms (e.g., wheelchair or bedside chair)?: None Help needed to walk in hospital room?: None Help needed climbing 3-5 steps with a railing? : None 6 Click Score: 24    End of Session Equipment Utilized During Treatment: Back brace Activity Tolerance: Patient tolerated treatment well Patient left: with call bell/phone within reach(seated EOB) Nurse Communication: Mobility status PT Visit Diagnosis: Other abnormalities of gait and mobility (R26.89)    Time: 6803-2122 PT Time Calculation (min) (ACUTE ONLY): 8 min   Charges:   PT Evaluation $PT Eval Low Complexity: [place], PT, DPT Acute Rehabilitation Services  Pager [phone] Office [place] 04/04/2019, 8:31 AM

## 2019-04-04 NOTE — Progress Notes (Signed)
Patient is discharged from room 3C08 at this time> Alert and in stable condition. IV site d/c'd and instructions read to patient with understanding verbalized. Ambulate out of unit with all belongings at side.

## 2019-04-06 IMAGING — RF DG C-ARM 61-120 MIN
1 series · 3 of 3 positions shown · non-contrast
Comparison: MRI 06/09/2017

CLINICAL DATA: Lumbar fusion.

EXAM:
LUMBAR SPINE - 2-3 VIEW; DG C-ARM 61-120 MIN

[Series 1: run · 3 of 3 slices shown]
[im 1/3]
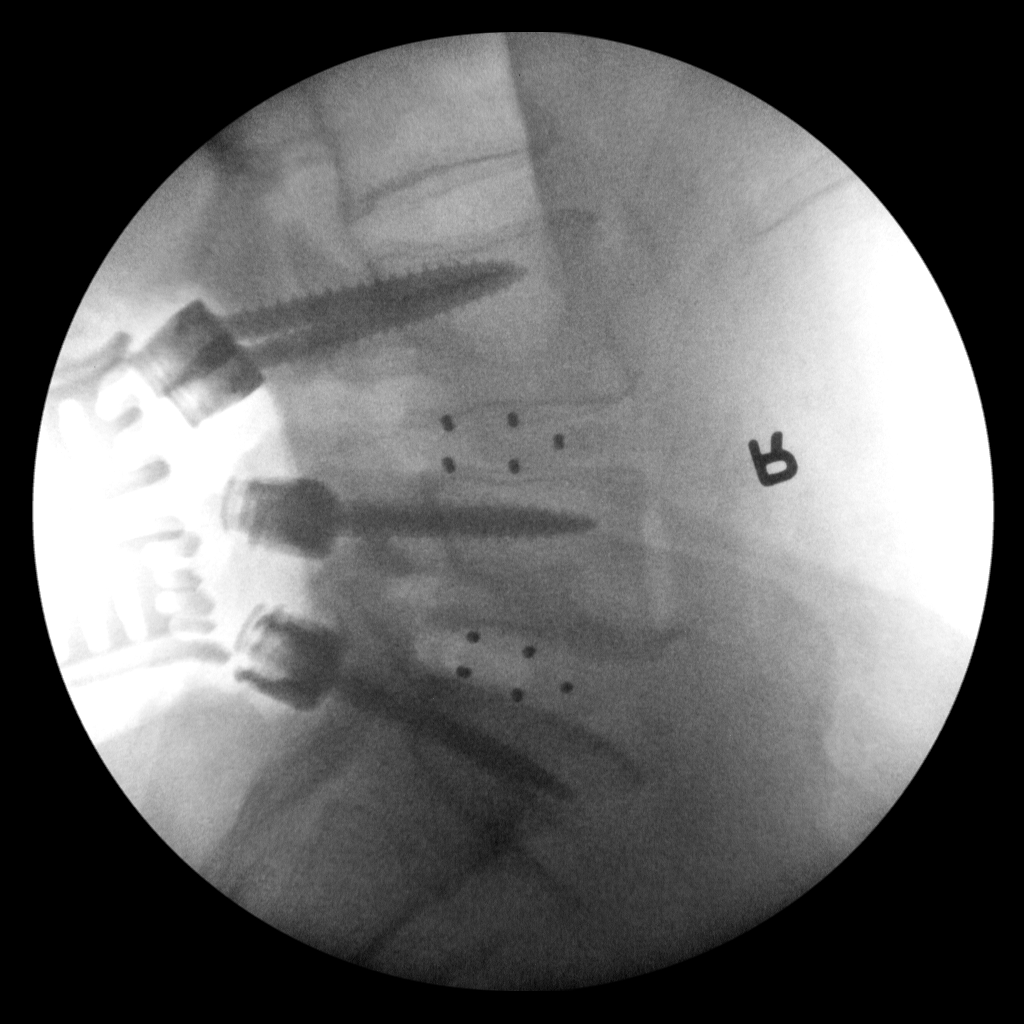
[im 2/3]
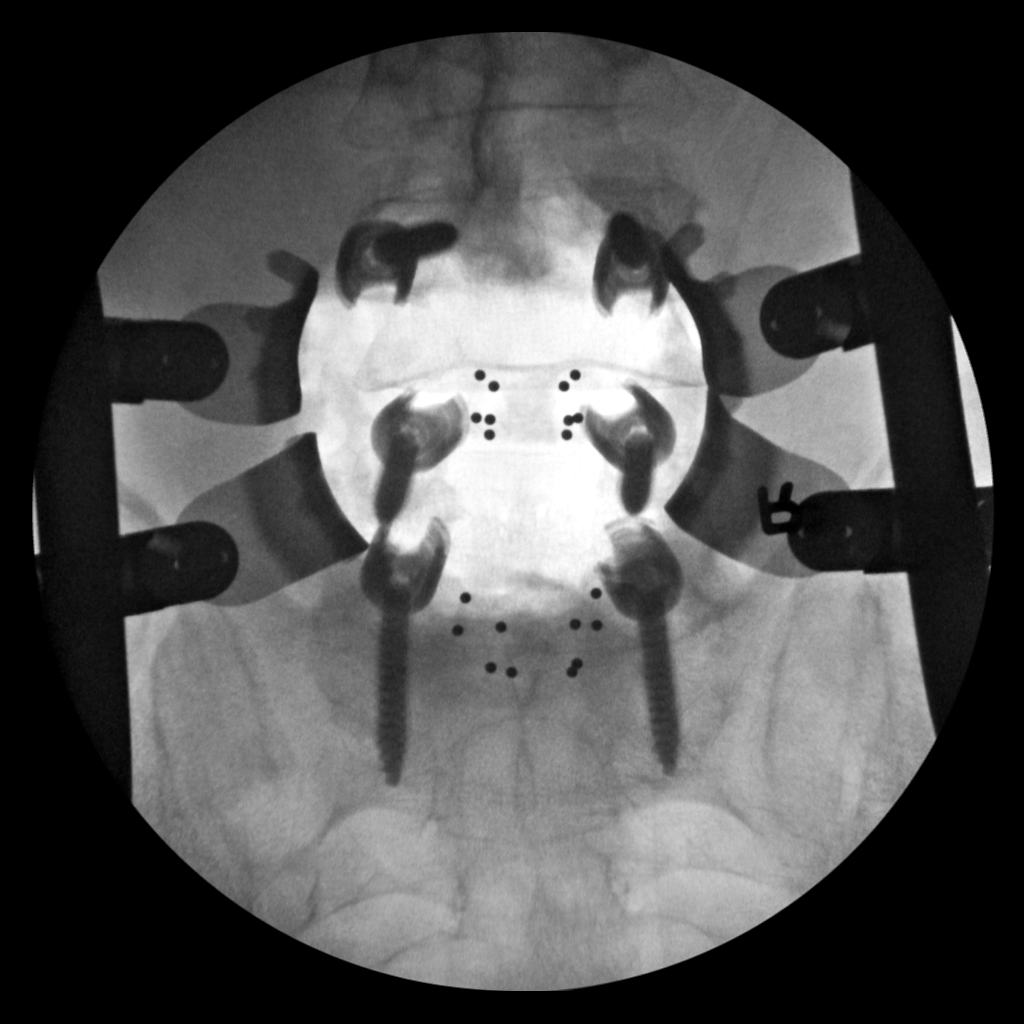
[im 3/3]
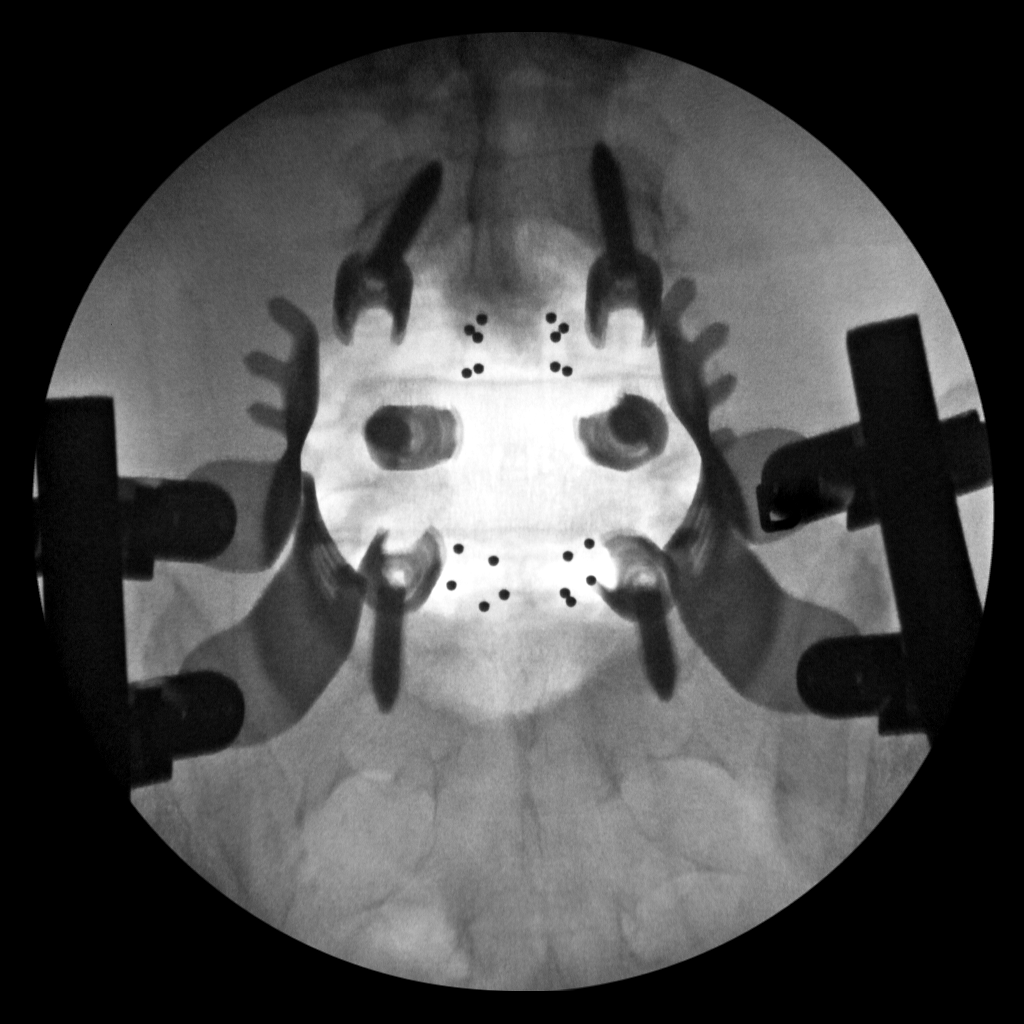

[3 of 3 positions shown; findings below may reference images not displayed]

FINDINGS: Lumbar spine numbered with the lowest segmented appearing lumbar
vertebrae on lateral view as L5. L4 through S1 posterior pedicle
screws. Interbody fusion devices L4-L5 and L5-S1. Hardware intact.
Anatomic alignment.
IMPRESSION: Postsurgical changes lumbosacral spine as above.

## 2019-04-06 MED FILL — Sodium Chloride IV Soln 0.9%: INTRAVENOUS | Qty: 1000 | Status: AC

## 2019-04-06 MED FILL — Heparin Sodium (Porcine) Inj 1000 Unit/ML: INTRAMUSCULAR | Qty: 30 | Status: AC

## 2019-06-04 ENCOUNTER — Other Ambulatory Visit: Payer: Self-pay | Admitting: Neurosurgery

## 2019-06-04 DIAGNOSIS — M5416 Radiculopathy, lumbar region: Secondary | ICD-10-CM

## 2019-06-06 ENCOUNTER — Other Ambulatory Visit: Payer: BC Managed Care – PPO

## 2019-06-07 ENCOUNTER — Other Ambulatory Visit: Payer: Self-pay

## 2019-06-07 ENCOUNTER — Ambulatory Visit
Admission: RE | Admit: 2019-06-07 | Discharge: 2019-06-07 | Disposition: A | Discharge status: Home / Self Care | Payer: BC Managed Care – PPO | Source: Ambulatory Visit | Attending: Neurosurgery | Admitting: Neurosurgery

## 2019-06-07 DIAGNOSIS — M5416 Radiculopathy, lumbar region: Secondary | ICD-10-CM

## 2019-06-07 MED ORDER — GADOBENATE DIMEGLUMINE 529 MG/ML IV SOLN
20.0000 mL | Freq: Once | INTRAVENOUS | Status: AC | PRN
  Administered 2019-06-07: 11:00:00 20 mL via INTRAVENOUS

## 2019-06-26 ENCOUNTER — Other Ambulatory Visit: Payer: Self-pay | Admitting: Neurosurgery

## 2019-06-27 ENCOUNTER — Other Ambulatory Visit: Payer: BC Managed Care – PPO

## 2019-07-04 NOTE — H&P (Signed)
Patient ID:   [id number] Patient: Brian Franco  Date of Birth: July 30, 1955 Visit Type: Office Visit   Date: 06/25/2019 01:15 PM Provider: Marchia Meiers. Vertell Limber MD   This 64 year old male presents for back pain/carpal tunnel.  HISTORY OF PRESENT ILLNESS:  1.  back pain/carpal tunnel  The patient returns to review his imaging and treatment options.  He is frustrated and says he is getting worse.  Is currently grading his pain at 10/10.  The CT scan shows that there is not a fracture of the L3 vertebra but there is evidence of nerve root compression due to stenosis at the L3-4 level.  There also appears to be incomplete fusion at the L5-S1 level with crack screws in the S1 level.  My impression is that this is not source of his pain as he got better after his 1st surgery and if he had a pseudoarthrosis which was causing him symptoms he would not have improved.  I explained that revision of the L5-S1 level would involve pelvic fixation which would be a much bigger surgery and my inclination is to do the posterior decompression at L3-4 with extension of pedicle screw fixation up to the level of L3  The patient is also complaining of symptomatic right carpal tunnel syndrome and wants to have this addressed the same time         Medical/Surgical/Interim History Reviewed, no change.     PAST MEDICAL HISTORY, SURGICAL HISTORY, FAMILY HISTORY, SOCIAL HISTORY AND REVIEW OF SYSTEMS I have reviewed the patient's past medical, surgical, family and social history as well as the comprehensive review of systems as included on the [facility] history form dated 05/29/2019, which I have signed.  Family History:  Reviewed, no changes.    Social History: Reviewed, no changes.   MEDICATIONS: (added, continued or stopped this visit) Started Medication Directions Instruction Stopped   citalopram 20 mg tablet take 1 tablet by oral route  every day    04/18/2019 Endocet 7.5  mg-325 mg tablet take 1 tablet by oral route  every 6 hours as needed     fenofibrate 160 mg tablet take 1 tablet by oral route  every day    04/25/2019 ibuprofen 800 mg tablet take 1 tablet by oral route 3 times every day with food as needed    05/21/2019 Medrol (Pak) 4 mg tablets in a dose pack take by Oral route as directed  06/26/2019  06/20/2019 Robaxin-750  750 mg tablet take 1 tablet by oral route  every 6 hours prn spasm       ALLERGIES: Ingredient Reaction Medication Name Comment  NO KNOWN ALLERGIES     No known allergies. Reviewed, no changes.    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  Patient is aware of the risks and benefits of surgery and wishes to proceed on an expedited basis  Orders: Diagnostic Procedures: Assessment Procedure  M54.16 Lumbar Spine- AP/Lat  Instruction(s)/Education: Assessment Instruction  R03.0 Lifestyle education  Z68.30 Dietary management education,  guidance, and counseling  Miscellaneous: Assessment   M48.062 LSO Brace   Completed Orders (this encounter) Order Details Reason Side Interpretation Result Initial Treatment Date Region  Lifestyle education Patient will monitor and contact primary care physician if needed.        Dietary management education, guidance, and counseling Encouraged patient to eat well balanced diet.         Assessment/Plan   # Detail Type Description   1. Assessment Low back pain, unspecified back pain laterality, with sciatica presence unspecified (M54.5).       2. Assessment  Bilateral carpal tunnel syndrome (G56.03).       3. 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   Plan Orders LSO Brace.       4. Assessment Radiculopathy, lumbar region (M54.16).       5. Assessment Elevated blood-pressure reading, w/o diagnosis of htn (R03.0).       6. Assessment Body mass index (BMI) 30.0-30.9, adult (Z68.30).   Plan Orders Today's instructions / counseling include(s) Dietary management education, guidance, and counseling. Clinical information/comments: Encouraged patient to eat well balanced diet.         Pain Management Plan Pain Scale: 10/10. Method: Numeric Pain Intensity Scale. Location: back, hands. Onset: 04/21/2017. Duration: varies. Quality: discomforting. Pain management follow-up plan of care: Patient will continue medication management..              Provider:  Danae Orleans. Venetia Maxon MD  06/29/2019 05:05 PM    Dictation edited by: Danae Orleans. Venetia Maxon    CC Providers: Alcide Goodness [facility] [address] [place],  [place]  [address]-   [facility]  [facility] [address] [place], [place] [address]-               Electronically signed by Danae Orleans. Venetia Maxon MD on 06/29/2019 05:05 PM

## 2019-07-12 ENCOUNTER — Encounter (HOSPITAL_COMMUNITY): Payer: Self-pay

## 2019-07-12 NOTE — Progress Notes (Signed)
[facility] - [place], [place] - [address] [facility] [address] [place] [address] Phone: [phone] Fax: [phone]  [facility] [phone] [facility] - [place], [place] - [address] [facility] [address] [place] [address] Phone: [phone] Fax: [phone]      Your procedure is scheduled on July 17 2019.  Report to [facility] Main Entrance "A" at 5:30A.M., and check in at the Admitting office.  Call this number if you have problems the morning of surgery:  [phone]  Call [phone] if you have any questions prior to your surgery date Monday-Friday 8am-4pm    Remember:  Do not eat or drink after midnight the night before your surgery    Take these medicines the morning of surgery with A SIP OF WATER: citalopram (CELEXA)  methocarbamol (ROBAXIN) oxyCODONE-acetaminophen (PERCOCET) - as needed for pain  7 days prior to surgery STOP taking any Aspirin (unless otherwise instructed by your surgeon), Aleve, Naproxen, Ibuprofen, Motrin, Advil, Goody's, BC's, all herbal medications, fish oil, and all vitamins.    The Morning of Surgery  Do not wear jewelry.  Do not wear lotions, powders, colognes, or deodorant    Men may shave face and neck.  Do not bring valuables to the hospital.  [facility] is not responsible for any belongings or valuables.  If you are a smoker, DO NOT Smoke 24 hours prior to surgery  If you wear a CPAP at night please bring your mask the morning of surgery   Remember that you must have someone to transport you home after your surgery, and remain with you for 24 hours if you are discharged the same day.   Please bring cases for contacts, glasses, hearing aids, dentures or bridgework because it cannot be worn into surgery.    Leave your suitcase in the car.  After surgery it may be brought to your room.  For patients admitted to the hospital, discharge time will be determined by your treatment  team.  Patients discharged the day of surgery will not be allowed to drive home.    Special instructions:   Show [place]- Preparing For Surgery  Before surgery, you can play an important role. Because skin is not sterile, your skin needs to be as free of germs as possible. You can reduce the number of germs on your skin by washing with CHG (chlorahexidine gluconate) Soap before surgery.  CHG is an antiseptic cleaner which kills germs and bonds with the skin to continue killing germs even after washing.    Oral Hygiene is also important to reduce your risk of infection.  Remember - BRUSH YOUR TEETH THE MORNING OF SURGERY WITH YOUR REGULAR TOOTHPASTE  Please do not use if you have an allergy to CHG or antibacterial soaps. If your skin becomes reddened/irritated stop using the CHG.  Do not shave (including legs and underarms) for at least 48 hours prior to first CHG shower. It is OK to shave your face.  Please follow these instructions carefully.   1. Shower the NIGHT BEFORE SURGERY and the MORNING OF SURGERY with CHG Soap.   2. If you chose to wash your hair, wash your hair first as usual with your normal shampoo.  3. After you shampoo, rinse your hair and body thoroughly to remove the shampoo.  4. Use CHG as you would any other liquid soap. You can apply CHG directly to the skin and wash gently with a scrungie or a clean washcloth.  5. Apply the CHG Soap to your body ONLY FROM THE NECK DOWN.  Do not use on open wounds or open sores. Avoid contact with your eyes, ears, mouth and genitals (private parts). Wash Face and genitals (private parts)  with your normal soap.   6. Wash thoroughly, paying special attention to the area where your surgery will be performed.  7. Thoroughly rinse your body with warm water from the neck down.  8. DO NOT shower/wash with your normal soap after using and rinsing off the CHG Soap.  9. Pat yourself dry with a CLEAN TOWEL.  10. Wear CLEAN PAJAMAS to bed  the night before surgery, wear comfortable clothes the morning of surgery  11. Place CLEAN SHEETS on your bed the night of your first shower and DO NOT SLEEP WITH PETS.    Day of Surgery:  Please shower the morning of surgery with the CHG soap Do not apply any deodorants/lotions. Please wear clean clothes to the hospital/surgery center.   Remember to brush your teeth WITH YOUR REGULAR TOOTHPASTE.   Please read over the following fact sheets that you were given.

## 2019-07-12 NOTE — Progress Notes (Signed)
Patient denies shortness of breath, fever, cough and chest pain.  PCP - Dr Marisue Ivan Cardiologist - denies  Chest x-ray - denies EKG - 02/16/19 [facility] - requested tracing Stress Test - denies ECHO - 03/06/18 [facility] Cardiac Cath - denies  Sleep Study - Yes CPAP - Uses CPAP nightly  Anesthesia review: No  Coronavirus Screening Covid test scheduled on 07/13/19 at 1430. Have you experienced the following symptoms:  Cough yes/no: No Fever (>100.51F)  yes/no: No Runny nose yes/no: No Sore throat yes/no: No Difficulty breathing/shortness of breath  yes/no: No  Have you traveled in the last 14 days and where? yes/no: No  Patient verbalized understanding of instructions that were given to them at the PAT appointment.

## 2019-07-13 ENCOUNTER — Encounter (HOSPITAL_COMMUNITY): Payer: Self-pay

## 2019-07-13 ENCOUNTER — Encounter (HOSPITAL_COMMUNITY)
Admission: RE | Admit: 2019-07-13 | Discharge: 2019-07-13 | Disposition: A | Discharge status: Home / Self Care | Payer: BC Managed Care – PPO | Source: Ambulatory Visit | Attending: Neurosurgery | Admitting: Neurosurgery

## 2019-07-13 ENCOUNTER — Other Ambulatory Visit: Payer: Self-pay

## 2019-07-13 ENCOUNTER — Other Ambulatory Visit (HOSPITAL_COMMUNITY)
Admission: RE | Admit: 2019-07-13 | Discharge: 2019-07-13 | Disposition: A | Discharge status: Home / Self Care | Payer: BC Managed Care – PPO | Source: Ambulatory Visit | Attending: Neurosurgery | Admitting: Neurosurgery

## 2019-07-13 DIAGNOSIS — Z01812 Encounter for preprocedural laboratory examination: Secondary | ICD-10-CM | POA: Insufficient documentation

## 2019-07-13 DIAGNOSIS — Z20822 Contact with and (suspected) exposure to covid-19: Secondary | ICD-10-CM | POA: Insufficient documentation

## 2019-07-13 LAB — SURGICAL PCR SCREEN
MRSA, PCR: NEGATIVE
Staphylococcus aureus: NEGATIVE

## 2019-07-13 LAB — CBC
HCT: 42.8 % (ref 39.0–52.0)
Hemoglobin: 14.3 g/dL (ref 13.0–17.0)
MCH: 30.6 pg (ref 26.0–34.0)
MCHC: 33.4 g/dL (ref 30.0–36.0)
MCV: 91.5 fL (ref 80.0–100.0)
Platelets: 227 10*3/uL (ref 150–400)
RBC: 4.68 MIL/uL (ref 4.22–5.81)
RDW: 12.7 % (ref 11.5–15.5)
WBC: 7.4 10*3/uL (ref 4.0–10.5)
nRBC: 0 % (ref 0.0–0.2)

## 2019-07-13 LAB — TYPE AND SCREEN
ABO/RH(D): A NEG
Antibody Screen: NEGATIVE

## 2019-07-14 LAB — SARS CORONAVIRUS 2 (TAT 6-24 HRS): SARS Coronavirus 2: NEGATIVE

## 2019-07-16 MED ORDER — DEXTROSE 5 % IV SOLN
3.0000 g | INTRAVENOUS | Status: AC
  Administered 2019-07-17: 3 g via INTRAVENOUS
  Filled 2019-07-16: qty 3

## 2019-07-17 ENCOUNTER — Inpatient Hospital Stay (HOSPITAL_COMMUNITY): Payer: BC Managed Care – PPO

## 2019-07-17 ENCOUNTER — Inpatient Hospital Stay (HOSPITAL_COMMUNITY): Payer: BC Managed Care – PPO | Admitting: Anesthesiology

## 2019-07-17 ENCOUNTER — Inpatient Hospital Stay (HOSPITAL_COMMUNITY)
Admission: RE | Admit: 2019-07-17 | Discharge: 2019-07-18 | DRG: 460 | Disposition: A | Discharge status: Home / Self Care | Payer: BC Managed Care – PPO | Attending: Neurosurgery | Admitting: Neurosurgery

## 2019-07-17 ENCOUNTER — Other Ambulatory Visit: Payer: Self-pay

## 2019-07-17 ENCOUNTER — Encounter (HOSPITAL_COMMUNITY): Payer: Self-pay | Admitting: Neurosurgery

## 2019-07-17 ENCOUNTER — Encounter (HOSPITAL_COMMUNITY)
Admission: RE | Disposition: A | Discharge status: Home / Self Care | Payer: Self-pay | Source: Home / Self Care | Attending: Neurosurgery

## 2019-07-17 DIAGNOSIS — M5416 Radiculopathy, lumbar region: Secondary | ICD-10-CM | POA: Diagnosis present

## 2019-07-17 DIAGNOSIS — Z20822 Contact with and (suspected) exposure to covid-19: Secondary | ICD-10-CM | POA: Diagnosis present

## 2019-07-17 DIAGNOSIS — M48062 Spinal stenosis, lumbar region with neurogenic claudication: Secondary | ICD-10-CM | POA: Diagnosis present

## 2019-07-17 DIAGNOSIS — G5603 Carpal tunnel syndrome, bilateral upper limbs: Secondary | ICD-10-CM | POA: Diagnosis present

## 2019-07-17 DIAGNOSIS — Z79899 Other long term (current) drug therapy: Secondary | ICD-10-CM

## 2019-07-17 DIAGNOSIS — Z419 Encounter for procedure for purposes other than remedying health state, unspecified: Secondary | ICD-10-CM

## 2019-07-17 DIAGNOSIS — M48061 Spinal stenosis, lumbar region without neurogenic claudication: Secondary | ICD-10-CM | POA: Diagnosis present

## 2019-07-17 HISTORY — PX: CARPAL TUNNEL RELEASE: SHX101

## 2019-07-17 SURGERY — POSTERIOR LUMBAR FUSION 1 LEVEL
Anesthesia: General | Site: Hand | Laterality: Right

## 2019-07-17 MED ORDER — LIDOCAINE 2% (20 MG/ML) 5 ML SYRINGE
INTRAMUSCULAR | Status: AC
  Filled 2019-07-17: qty 5

## 2019-07-17 MED ORDER — FENTANYL CITRATE (PF) 100 MCG/2ML IJ SOLN
INTRAMUSCULAR | Status: DC | PRN
  Administered 2019-07-17 (×2): 50 ug via INTRAVENOUS
  Administered 2019-07-17: 100 ug via INTRAVENOUS
  Administered 2019-07-17 (×3): 50 ug via INTRAVENOUS

## 2019-07-17 MED ORDER — BUPIVACAINE HCL (PF) 0.5 % IJ SOLN
INTRAMUSCULAR | Status: AC
  Filled 2019-07-17: qty 30

## 2019-07-17 MED ORDER — SODIUM CHLORIDE 0.9% FLUSH: 3.0000 mL | INTRAVENOUS | Status: DC | PRN

## 2019-07-17 MED ORDER — FENOFIBRATE 160 MG PO TABS: 160.0000 mg | ORAL_TABLET | Freq: Every day | ORAL | Status: DC

## 2019-07-17 MED ORDER — HYDROCODONE-ACETAMINOPHEN 10-325 MG PO TABS: 1.0000 | ORAL_TABLET | ORAL | Status: DC | PRN

## 2019-07-17 MED ORDER — FENTANYL CITRATE (PF) 250 MCG/5ML IJ SOLN
INTRAMUSCULAR | Status: AC
  Filled 2019-07-17: qty 5

## 2019-07-17 MED ORDER — POLYETHYLENE GLYCOL 3350 17 G PO PACK: 17.0000 g | PACK | Freq: Every day | ORAL | Status: DC | PRN

## 2019-07-17 MED ORDER — SODIUM CHLORIDE 0.9 % IV SOLN
250.0000 mL | INTRAVENOUS | Status: DC
  Administered 2019-07-17: 250 mL via INTRAVENOUS

## 2019-07-17 MED ORDER — FLEET ENEMA 7-19 GM/118ML RE ENEM: 1.0000 | ENEMA | Freq: Once | RECTAL | Status: DC | PRN

## 2019-07-17 MED ORDER — MEPERIDINE HCL 25 MG/ML IJ SOLN: 6.2500 mg | INTRAMUSCULAR | Status: DC | PRN

## 2019-07-17 MED ORDER — MENTHOL 3 MG MT LOZG: 1.0000 | LOZENGE | OROMUCOSAL | Status: DC | PRN

## 2019-07-17 MED ORDER — THROMBIN 5000 UNITS EX SOLR: OROMUCOSAL | Status: DC | PRN

## 2019-07-17 MED ORDER — BISACODYL 10 MG RE SUPP: 10.0000 mg | Freq: Every day | RECTAL | Status: DC | PRN

## 2019-07-17 MED ORDER — EPHEDRINE SULFATE-NACL 50-0.9 MG/10ML-% IV SOSY
PREFILLED_SYRINGE | INTRAVENOUS | Status: DC | PRN
  Administered 2019-07-17: 10 mg via INTRAVENOUS
  Administered 2019-07-17: 5 mg via INTRAVENOUS
  Administered 2019-07-17: 7.5 mg via INTRAVENOUS
  Administered 2019-07-17 (×3): 5 mg via INTRAVENOUS

## 2019-07-17 MED ORDER — ONDANSETRON HCL 4 MG/2ML IJ SOLN
INTRAMUSCULAR | Status: AC
  Filled 2019-07-17: qty 2

## 2019-07-17 MED ORDER — ONDANSETRON HCL 4 MG/2ML IJ SOLN
INTRAMUSCULAR | Status: DC | PRN
  Administered 2019-07-17: 4 mg via INTRAVENOUS

## 2019-07-17 MED ORDER — LIDOCAINE-EPINEPHRINE 1 %-1:100000 IJ SOLN
INTRAMUSCULAR | Status: DC | PRN
  Administered 2019-07-17: 5 mL

## 2019-07-17 MED ORDER — METHOCARBAMOL 750 MG PO TABS: 750.0000 mg | ORAL_TABLET | Freq: Three times a day (TID) | ORAL | Status: DC | PRN

## 2019-07-17 MED ORDER — EPHEDRINE 5 MG/ML INJ
INTRAVENOUS | Status: AC
  Filled 2019-07-17: qty 10

## 2019-07-17 MED ORDER — ZOLPIDEM TARTRATE 5 MG PO TABS: 5.0000 mg | ORAL_TABLET | Freq: Every evening | ORAL | Status: DC | PRN

## 2019-07-17 MED ORDER — PANTOPRAZOLE SODIUM 40 MG PO TBEC
40.0000 mg | DELAYED_RELEASE_TABLET | Freq: Every day | ORAL | Status: DC
  Administered 2019-07-17: 40 mg via ORAL
  Filled 2019-07-17: qty 1

## 2019-07-17 MED ORDER — PHENYLEPHRINE HCL-NACL 10-0.9 MG/250ML-% IV SOLN
INTRAVENOUS | Status: DC | PRN
  Administered 2019-07-17: 40 ug/min via INTRAVENOUS

## 2019-07-17 MED ORDER — LIDOCAINE HCL 1 % IJ SOLN
INTRAMUSCULAR | Status: AC
  Filled 2019-07-17: qty 20

## 2019-07-17 MED ORDER — ONDANSETRON HCL 4 MG PO TABS: 4.0000 mg | ORAL_TABLET | Freq: Four times a day (QID) | ORAL | Status: DC | PRN

## 2019-07-17 MED ORDER — PHENYLEPHRINE 40 MCG/ML (10ML) SYRINGE FOR IV PUSH (FOR BLOOD PRESSURE SUPPORT)
PREFILLED_SYRINGE | INTRAVENOUS | Status: AC
  Filled 2019-07-17: qty 10

## 2019-07-17 MED ORDER — OXYCODONE-ACETAMINOPHEN 7.5-325 MG PO TABS: 1.0000 | ORAL_TABLET | Freq: Four times a day (QID) | ORAL | Status: DC | PRN

## 2019-07-17 MED ORDER — ONDANSETRON HCL 4 MG/2ML IJ SOLN: 4.0000 mg | Freq: Once | INTRAMUSCULAR | Status: DC | PRN

## 2019-07-17 MED ORDER — GLYCOPYRROLATE PF 0.2 MG/ML IJ SOSY
PREFILLED_SYRINGE | INTRAMUSCULAR | Status: DC | PRN
  Administered 2019-07-17 (×2): .1 mg via INTRAVENOUS

## 2019-07-17 MED ORDER — 0.9 % SODIUM CHLORIDE (POUR BTL) OPTIME
TOPICAL | Status: DC | PRN
  Administered 2019-07-17: 1000 mL

## 2019-07-17 MED ORDER — MIDAZOLAM HCL 5 MG/5ML IJ SOLN
INTRAMUSCULAR | Status: DC | PRN
  Administered 2019-07-17: 2 mg via INTRAVENOUS

## 2019-07-17 MED ORDER — LIDOCAINE 2% (20 MG/ML) 5 ML SYRINGE
INTRAMUSCULAR | Status: DC | PRN
  Administered 2019-07-17: 100 mg via INTRAVENOUS

## 2019-07-17 MED ORDER — CEFAZOLIN SODIUM-DEXTROSE 2-4 GM/100ML-% IV SOLN
2.0000 g | Freq: Three times a day (TID) | INTRAVENOUS | Status: AC
  Administered 2019-07-17 – 2019-07-18 (×2): 2 g via INTRAVENOUS
  Filled 2019-07-17 (×2): qty 100

## 2019-07-17 MED ORDER — PHENOL 1.4 % MT LIQD: 1.0000 | OROMUCOSAL | Status: DC | PRN

## 2019-07-17 MED ORDER — ROCURONIUM BROMIDE 10 MG/ML (PF) SYRINGE
PREFILLED_SYRINGE | INTRAVENOUS | Status: DC | PRN
  Administered 2019-07-17: 20 mg via INTRAVENOUS
  Administered 2019-07-17: 10 mg via INTRAVENOUS
  Administered 2019-07-17: 100 mg via INTRAVENOUS

## 2019-07-17 MED ORDER — PROPOFOL 10 MG/ML IV BOLUS
INTRAVENOUS | Status: AC
  Filled 2019-07-17: qty 40

## 2019-07-17 MED ORDER — ACETAMINOPHEN 325 MG PO TABS
650.0000 mg | ORAL_TABLET | ORAL | Status: DC | PRN
  Administered 2019-07-17 – 2019-07-18 (×2): 650 mg via ORAL
  Filled 2019-07-17 (×2): qty 2

## 2019-07-17 MED ORDER — HYDROMORPHONE HCL 1 MG/ML IJ SOLN: 1.0000 mg | INTRAMUSCULAR | Status: DC | PRN

## 2019-07-17 MED ORDER — DEXAMETHASONE SODIUM PHOSPHATE 10 MG/ML IJ SOLN
INTRAMUSCULAR | Status: DC | PRN
  Administered 2019-07-17: 10 mg via INTRAVENOUS

## 2019-07-17 MED ORDER — ONDANSETRON HCL 4 MG/2ML IJ SOLN: 4.0000 mg | Freq: Four times a day (QID) | INTRAMUSCULAR | Status: DC | PRN

## 2019-07-17 MED ORDER — ACETAMINOPHEN 650 MG RE SUPP: 650.0000 mg | RECTAL | Status: DC | PRN

## 2019-07-17 MED ORDER — CYCLOBENZAPRINE HCL 10 MG PO TABS: 10.0000 mg | ORAL_TABLET | Freq: Three times a day (TID) | ORAL | Status: DC | PRN

## 2019-07-17 MED ORDER — OXYCODONE HCL 5 MG PO TABS
10.0000 mg | ORAL_TABLET | ORAL | Status: DC | PRN
  Administered 2019-07-17 – 2019-07-18 (×6): 10 mg via ORAL
  Filled 2019-07-17 (×6): qty 2

## 2019-07-17 MED ORDER — THROMBIN 20000 UNITS EX SOLR
CUTANEOUS | Status: AC
  Filled 2019-07-17: qty 20000

## 2019-07-17 MED ORDER — BUPIVACAINE LIPOSOME 1.3 % IJ SUSP
20.0000 mL | INTRAMUSCULAR | Status: AC
  Administered 2019-07-17: 20 mL
  Filled 2019-07-17: qty 20

## 2019-07-17 MED ORDER — BUPIVACAINE HCL (PF) 0.5 % IJ SOLN
INTRAMUSCULAR | Status: DC | PRN
  Administered 2019-07-17: 5 mL

## 2019-07-17 MED ORDER — HYDROMORPHONE HCL 1 MG/ML IJ SOLN
0.2500 mg | INTRAMUSCULAR | Status: DC | PRN
  Administered 2019-07-17 (×2): 0.5 mg via INTRAVENOUS

## 2019-07-17 MED ORDER — LIDOCAINE-EPINEPHRINE 1 %-1:100000 IJ SOLN
INTRAMUSCULAR | Status: AC
  Filled 2019-07-17: qty 2

## 2019-07-17 MED ORDER — KCL IN DEXTROSE-NACL 20-5-0.45 MEQ/L-%-% IV SOLN: INTRAVENOUS | Status: DC

## 2019-07-17 MED ORDER — DEXAMETHASONE SODIUM PHOSPHATE 10 MG/ML IJ SOLN
INTRAMUSCULAR | Status: AC
  Filled 2019-07-17: qty 1

## 2019-07-17 MED ORDER — CITALOPRAM HYDROBROMIDE 20 MG PO TABS: 20.0000 mg | ORAL_TABLET | Freq: Every day | ORAL | Status: DC

## 2019-07-17 MED ORDER — MIDAZOLAM HCL 2 MG/2ML IJ SOLN
INTRAMUSCULAR | Status: AC
  Filled 2019-07-17: qty 2

## 2019-07-17 MED ORDER — LIDOCAINE HCL (PF) 1 % IJ SOLN
INTRAMUSCULAR | Status: DC | PRN
  Administered 2019-07-17: 5 mL

## 2019-07-17 MED ORDER — DOCUSATE SODIUM 100 MG PO CAPS
100.0000 mg | ORAL_CAPSULE | Freq: Two times a day (BID) | ORAL | Status: DC
  Administered 2019-07-17 (×2): 100 mg via ORAL
  Filled 2019-07-17: qty 1

## 2019-07-17 MED ORDER — SUGAMMADEX SODIUM 200 MG/2ML IV SOLN
INTRAVENOUS | Status: DC | PRN
  Administered 2019-07-17: 225 mg via INTRAVENOUS

## 2019-07-17 MED ORDER — METHOCARBAMOL 500 MG PO TABS
500.0000 mg | ORAL_TABLET | Freq: Four times a day (QID) | ORAL | Status: DC | PRN
  Administered 2019-07-17 – 2019-07-18 (×4): 500 mg via ORAL
  Filled 2019-07-17 (×3): qty 1

## 2019-07-17 MED ORDER — LACTATED RINGERS IV SOLN: INTRAVENOUS | Status: DC | PRN

## 2019-07-17 MED ORDER — PROPOFOL 10 MG/ML IV BOLUS
INTRAVENOUS | Status: DC | PRN
  Administered 2019-07-17: 70 mg via INTRAVENOUS
  Administered 2019-07-17: 100 mg via INTRAVENOUS

## 2019-07-17 MED ORDER — METHOCARBAMOL 500 MG PO TABS
ORAL_TABLET | ORAL | Status: AC
  Filled 2019-07-17: qty 1

## 2019-07-17 MED ORDER — SODIUM CHLORIDE 0.9% FLUSH
3.0000 mL | Freq: Two times a day (BID) | INTRAVENOUS | Status: DC
  Administered 2019-07-17 (×2): 3 mL via INTRAVENOUS

## 2019-07-17 MED ORDER — CHLORHEXIDINE GLUCONATE CLOTH 2 % EX PADS: 6.0000 | MEDICATED_PAD | Freq: Once | CUTANEOUS | Status: DC

## 2019-07-17 MED ORDER — THROMBIN 20000 UNITS EX SOLR: CUTANEOUS | Status: DC | PRN

## 2019-07-17 MED ORDER — ALUM & MAG HYDROXIDE-SIMETH 200-200-20 MG/5ML PO SUSP: 30.0000 mL | Freq: Four times a day (QID) | ORAL | Status: DC | PRN

## 2019-07-17 MED ORDER — PANTOPRAZOLE SODIUM 40 MG IV SOLR: 40.0000 mg | Freq: Every day | INTRAVENOUS | Status: DC

## 2019-07-17 MED ORDER — PHENYLEPHRINE 40 MCG/ML (10ML) SYRINGE FOR IV PUSH (FOR BLOOD PRESSURE SUPPORT)
PREFILLED_SYRINGE | INTRAVENOUS | Status: DC | PRN
  Administered 2019-07-17: 80 ug via INTRAVENOUS

## 2019-07-17 MED ORDER — ROCURONIUM BROMIDE 10 MG/ML (PF) SYRINGE
PREFILLED_SYRINGE | INTRAVENOUS | Status: AC
  Filled 2019-07-17: qty 10

## 2019-07-17 MED ORDER — HYDROMORPHONE HCL 1 MG/ML IJ SOLN
INTRAMUSCULAR | Status: AC
  Filled 2019-07-17: qty 1

## 2019-07-17 MED ORDER — THROMBIN 5000 UNITS EX SOLR
CUTANEOUS | Status: AC
  Filled 2019-07-17: qty 5000

## 2019-07-17 SURGICAL SUPPLY — 103 items
ADH SKN CLS APL DERMABOND .7 (GAUZE/BANDAGES/DRESSINGS) ×2
BASKET BONE COLLECTION (BASKET) ×4 IMPLANT
BLADE CLIPPER SURG (BLADE) ×2 IMPLANT
BLADE SURG 15 STRL LF DISP TIS (BLADE) ×2 IMPLANT
BLADE SURG 15 STRL SS (BLADE) ×4
BNDG CMPR 75X41 PLY ABS (GAUZE/BANDAGES/DRESSINGS) ×2
BNDG CMPR 75X41 PLY HI ABS (GAUZE/BANDAGES/DRESSINGS) ×2
BNDG GAUZE ELAST 4 BULKY (GAUZE/BANDAGES/DRESSINGS) ×4 IMPLANT
BNDG STRETCH 4X75 NS LF (GAUZE/BANDAGES/DRESSINGS) ×3 IMPLANT
BNDG STRETCH 4X75 STRL LF (GAUZE/BANDAGES/DRESSINGS) ×4 IMPLANT
BUR MATCHSTICK NEURO 3.0 LAGG (BURR) ×4 IMPLANT
BUR PRECISION FLUTE 5.0 (BURR) ×4 IMPLANT
CABLE BIPOLOR RESECTION CORD (MISCELLANEOUS) ×4 IMPLANT
CANISTER SUCT 3000ML PPV (MISCELLANEOUS) ×4 IMPLANT
CARTRIDGE OIL MAESTRO DRILL (MISCELLANEOUS) ×2 IMPLANT
CNTNR URN SCR LID CUP LEK RST (MISCELLANEOUS) ×2 IMPLANT
CONT SPEC 4OZ STRL OR WHT (MISCELLANEOUS) ×12
COVER BACK TABLE 60X90IN (DRAPES) ×4 IMPLANT
COVER WAND RF STERILE (DRAPES) ×2 IMPLANT
DECANTER SPIKE VIAL GLASS SM (MISCELLANEOUS) ×6 IMPLANT
DERMABOND ADVANCED (GAUZE/BANDAGES/DRESSINGS) ×2
DERMABOND ADVANCED .7 DNX12 (GAUZE/BANDAGES/DRESSINGS) ×2 IMPLANT
DIFFUSER DRILL AIR PNEUMATIC (MISCELLANEOUS) ×4 IMPLANT
DRAPE C-ARM 42X72 X-RAY (DRAPES) ×4 IMPLANT
DRAPE C-ARMOR (DRAPES) ×4 IMPLANT
DRAPE EXTREMITY T 121X128X90 (DISPOSABLE) ×4 IMPLANT
DRAPE HALF SHEET 40X57 (DRAPES) ×4 IMPLANT
DRAPE LAPAROTOMY 100X72X124 (DRAPES) ×4 IMPLANT
DRAPE SURG 17X23 STRL (DRAPES) ×4 IMPLANT
DRSG ADAPTIC 3X8 NADH LF (GAUZE/BANDAGES/DRESSINGS) ×2 IMPLANT
DRSG EMULSION OIL 3X3 NADH (GAUZE/BANDAGES/DRESSINGS) ×4 IMPLANT
DRSG OPSITE POSTOP 4X8 (GAUZE/BANDAGES/DRESSINGS) ×3 IMPLANT
DURAPREP 26ML APPLICATOR (WOUND CARE) ×7 IMPLANT
ELECT BLADE 4.0 EZ CLEAN MEGAD (MISCELLANEOUS) ×4
ELECT REM PT RETURN 9FT ADLT (ELECTROSURGICAL) ×4
ELECTRODE BLDE 4.0 EZ CLN MEGD (MISCELLANEOUS) ×1 IMPLANT
ELECTRODE REM PT RTRN 9FT ADLT (ELECTROSURGICAL) ×2 IMPLANT
EVACUATOR 1/8 PVC DRAIN (DRAIN) IMPLANT
GAUZE 4X4 16PLY RFD (DISPOSABLE) ×4 IMPLANT
GAUZE SPONGE 4X4 12PLY STRL (GAUZE/BANDAGES/DRESSINGS) ×6 IMPLANT
GLOVE BIO SURGEON STRL SZ 6.5 (GLOVE) ×4 IMPLANT
GLOVE BIO SURGEON STRL SZ8 (GLOVE) ×12 IMPLANT
GLOVE BIO SURGEONS STRL SZ 6.5 (GLOVE) ×4
GLOVE BIOGEL PI IND STRL 6.5 (GLOVE) ×2 IMPLANT
GLOVE BIOGEL PI IND STRL 7.5 (GLOVE) ×1 IMPLANT
GLOVE BIOGEL PI IND STRL 8 (GLOVE) ×4 IMPLANT
GLOVE BIOGEL PI IND STRL 8.5 (GLOVE) ×6 IMPLANT
GLOVE BIOGEL PI INDICATOR 6.5 (GLOVE) ×4
GLOVE BIOGEL PI INDICATOR 7.5 (GLOVE) ×2
GLOVE BIOGEL PI INDICATOR 8 (GLOVE) ×4
GLOVE BIOGEL PI INDICATOR 8.5 (GLOVE) ×6
GLOVE ECLIPSE 8.0 STRL XLNG CF (GLOVE) ×8 IMPLANT
GLOVE EXAM NITRILE XL STR (GLOVE) IMPLANT
GOWN STRL REUS W/ TWL LRG LVL3 (GOWN DISPOSABLE) ×2 IMPLANT
GOWN STRL REUS W/ TWL XL LVL3 (GOWN DISPOSABLE) ×4 IMPLANT
GOWN STRL REUS W/TWL 2XL LVL3 (GOWN DISPOSABLE) ×8 IMPLANT
GOWN STRL REUS W/TWL LRG LVL3 (GOWN DISPOSABLE) ×8
GOWN STRL REUS W/TWL XL LVL3 (GOWN DISPOSABLE) ×12
HEMOSTAT POWDER KIT SURGIFOAM (HEMOSTASIS) ×4 IMPLANT
KIT BASIN OR (CUSTOM PROCEDURE TRAY) ×6 IMPLANT
KIT INFUSE XX SMALL 0.7CC (Orthopedic Implant) ×3 IMPLANT
KIT POSITION SURG JACKSON T1 (MISCELLANEOUS) ×4 IMPLANT
KIT TURNOVER KIT B (KITS) ×5 IMPLANT
MILL MEDIUM DISP (BLADE) ×3 IMPLANT
NDL HYPO 21X1.5 SAFETY (NEEDLE) IMPLANT
NDL HYPO 25X1 1.5 SAFETY (NEEDLE) ×4 IMPLANT
NDL SPNL 18GX3.5 QUINCKE PK (NEEDLE) IMPLANT
NEEDLE HYPO 21X1.5 SAFETY (NEEDLE) ×4 IMPLANT
NEEDLE HYPO 25X1 1.5 SAFETY (NEEDLE) ×8 IMPLANT
NEEDLE SPNL 18GX3.5 QUINCKE PK (NEEDLE) IMPLANT
NS IRRIG 1000ML POUR BTL (IV SOLUTION) ×6 IMPLANT
OIL CARTRIDGE MAESTRO DRILL (MISCELLANEOUS) ×4
PACK LAMINECTOMY NEURO (CUSTOM PROCEDURE TRAY) ×4 IMPLANT
PACK SURGICAL SETUP 50X90 (CUSTOM PROCEDURE TRAY) ×4 IMPLANT
PAD ARMBOARD 7.5X6 YLW CONV (MISCELLANEOUS) ×15 IMPLANT
PATTIES SURGICAL .5 X.5 (GAUZE/BANDAGES/DRESSINGS) IMPLANT
PATTIES SURGICAL .5 X1 (DISPOSABLE) IMPLANT
PATTIES SURGICAL 1X1 (DISPOSABLE) IMPLANT
ROD RELIN-O LORD 5.5X65MM (Rod) ×3 IMPLANT
ROD RELINE O-H CON M 5.0/6.0MM (Rod) ×6 IMPLANT
ROD RELINE-O LORDOTIC 5.5X60MM (Rod) ×2 IMPLANT
SCREW LOCK RELINE 5.5 TULIP (Screw) ×8 IMPLANT
SCREW RELINE-O POLY 6.5X50MM (Screw) ×6 IMPLANT
SPONGE LAP 4X18 RFD (DISPOSABLE) IMPLANT
SPONGE SURGIFOAM ABS GEL 100 (HEMOSTASIS) ×2 IMPLANT
STAPLER SKIN PROX WIDE 3.9 (STAPLE) IMPLANT
STOCKINETTE 4X48 STRL (DRAPES) ×4 IMPLANT
SUT ETHILON 3 0 PS 1 (SUTURE) ×4 IMPLANT
SUT VIC AB 1 CT1 18XBRD ANBCTR (SUTURE) ×4 IMPLANT
SUT VIC AB 1 CT1 8-18 (SUTURE) ×8
SUT VIC AB 2-0 CT1 18 (SUTURE) ×8 IMPLANT
SUT VIC AB 3-0 SH 8-18 (SUTURE) ×8 IMPLANT
SYR 20ML LL LF (SYRINGE) ×3 IMPLANT
SYR 5ML LL (SYRINGE) IMPLANT
SYR BULB 3OZ (MISCELLANEOUS) ×4 IMPLANT
SYR CONTROL 10ML LL (SYRINGE) ×4 IMPLANT
TOWEL GREEN STERILE (TOWEL DISPOSABLE) ×8 IMPLANT
TOWEL GREEN STERILE FF (TOWEL DISPOSABLE) ×5 IMPLANT
TRAY FOLEY MTR SLVR 16FR STAT (SET/KITS/TRAYS/PACK) ×4 IMPLANT
TUBE CONNECTING 12'X1/4 (SUCTIONS) ×1
TUBE CONNECTING 12X1/4 (SUCTIONS) ×2 IMPLANT
UNDERPAD 30X30 (UNDERPADS AND DIAPERS) ×4 IMPLANT
WATER STERILE IRR 1000ML POUR (IV SOLUTION) ×5 IMPLANT

## 2019-07-17 NOTE — Interval H&P Note (Signed)
History and Physical Interval Note:  07/17/2019 7:27 AM  Brian Franco  has presented today for surgery, with the diagnosis of Spinal stenosis, Lumbar region with neurogenic claudication; Bilateral carpal tunnel syndrome.  The various methods of treatment have been discussed with the patient and family. After consideration of risks, benefits and other options for treatment, the patient has consented to  Procedure(s): Exploration of Lumbar 5 Sacral 1 Fusion, possible revision with posterior decompression at Lumbar 3-4 with pedicle screw fixation at Lumbar 3 (N/A) Right carpal tunnel release (Right) as a surgical intervention.  The patient's history has been reviewed, patient examined, no change in status, stable for surgery.  I have reviewed the patient's chart and labs.  Questions were answered to the patient's satisfaction.     [name]

## 2019-07-17 NOTE — Progress Notes (Signed)
Patient is awake, alert, conversant.  MAEW with good bilateral lower extremity strength.  Patient is doing well.

## 2019-07-17 NOTE — Anesthesia Preprocedure Evaluation (Addendum)
Anesthesia Evaluation  Patient identified by MRN, date of birth, ID band Patient awake    Reviewed: Allergy & Precautions, NPO status , Patient's Chart, lab work & pertinent test results  Airway Mallampati: I  TM Distance: >3 FB Neck ROM: Full    Dental  (+) Dental Advisory Given, Caps   Pulmonary sleep apnea , Current Smoker,    Pulmonary exam normal        Cardiovascular Normal cardiovascular exam     Neuro/Psych    GI/Hepatic negative GI ROS, Neg liver ROS,   Endo/Other  Morbid obesity  Renal/GU negative Renal ROS     Musculoskeletal   Abdominal   Peds  Hematology negative hematology ROS (+)   Anesthesia Other Findings   Reproductive/Obstetrics                            Anesthesia Physical Anesthesia Plan  ASA: III  Anesthesia Plan: General   Post-op Pain Management:    Induction: Intravenous  PONV Risk Score and Plan: 1 and Ondansetron and Midazolam  Airway Management Planned: Oral ETT  Additional Equipment:   Intra-op Plan:   Post-operative Plan: Extubation in OR  Informed Consent: I have reviewed the patients History and Physical, chart, labs and discussed the procedure including the risks, benefits and alternatives for the proposed anesthesia with the patient or authorized representative who has indicated his/her understanding and acceptance.       Plan Discussed with: CRNA and Surgeon  Anesthesia Plan Comments:         Anesthesia Quick Evaluation

## 2019-07-17 NOTE — Evaluation (Signed)
Physical Therapy Evaluation Patient Details Name: Brian Franco MRN: [id number] DOB: December 16, 1955 Today's Date: 07/17/2019   History of Present Illness  Pt is a 64 y/o male s/p L3-4 PLIF and R carpal tunnel release. PMH includes smoker, sleep apnea.   Clinical Impression  Patient is s/p above surgery resulting in the deficits listed below (see PT Problem List). Pt requiring min guard to supervision for mobility tasks. Educated about back precautions and generalized walking program. Reports wife will be able to assist at d/c. Patient will benefit from skilled PT to increase their independence and safety with mobility (while adhering to their precautions) to allow discharge to the venue listed below.     Follow Up Recommendations No PT follow up    Equipment Recommendations  None recommended by PT    Recommendations for Other Services       Precautions / Restrictions Precautions Precautions: Back Precaution Booklet Issued: Yes (comment) Precaution Comments: REviewed back precautions with pt.  Required Braces or Orthoses: Spinal Brace Spinal Brace: Lumbar corset;Applied in standing position Restrictions Weight Bearing Restrictions: No      Mobility  Bed Mobility Overal bed mobility: Needs Assistance Bed Mobility: Rolling;Sidelying to Sit;Sit to Sidelying Rolling: Supervision Sidelying to sit: Supervision     Sit to sidelying: Supervision General bed mobility comments: Supervision for safety. Cues for log roll technique.   Transfers Overall transfer level: Needs assistance Equipment used: None Transfers: Sit to/from Stand Sit to Stand: Min guard         General transfer comment: Min guard for safety. cues to wait for PT prior to standing.   Ambulation/Gait Ambulation/Gait assistance: Min guard;Supervision Gait Distance (Feet): 150 Feet Assistive device: IV Pole Gait Pattern/deviations: Step-through pattern;Decreased stride length Gait velocity: Decreased    General Gait Details: Guarded gait. Pt reports pain has improved in BLE. Educated about generalized walking program to perform at home.   Stairs            Wheelchair Mobility    Modified Rankin (Stroke Patients Only)       Balance Overall balance assessment: Needs assistance Sitting-balance support: No upper extremity supported;Feet supported Sitting balance-Leahy Scale: Good     Standing balance support: No upper extremity supported;During functional activity;Single extremity supported Standing balance-Leahy Scale: Fair Standing balance comment: Able to maintain static standing without UE support                              Pertinent Vitals/Pain Pain Assessment: Faces Faces Pain Scale: Hurts a little bit Pain Location: back  Pain Descriptors / Indicators: Aching;Operative site guarding Pain Intervention(s): Limited activity within patient's tolerance;Monitored during session;Repositioned    Home Living Family/patient expects to be discharged to:: Private residence Living Arrangements: Spouse/significant other Available Help at Discharge: Family;Available 24 hours/day Type of Home: House Home Access: Stairs to enter Entrance Stairs-Rails: None Entrance Stairs-Number of Steps: 1 Home Layout: One level Home Equipment: Bedside commode;Shower seat - built in      Prior Function Level of Independence: Independent               Hand Dominance        Extremity/Trunk Assessment   Upper Extremity Assessment Upper Extremity Assessment: RUE deficits/detail RUE Deficits / Details: RUE wrapped. Deficits consistent with post op pain and weakness.     Lower Extremity Assessment Lower Extremity Assessment: Overall WFL for tasks assessed    Cervical / Trunk Assessment Cervical /  Trunk Assessment: Other exceptions Cervical / Trunk Exceptions: s/p lumbar surgery   Communication   Communication: No difficulties  Cognition Arousal/Alertness:  Awake/alert Behavior During Therapy: WFL for tasks assessed/performed Overall Cognitive Status: Within Functional Limits for tasks assessed                                        General Comments General comments (skin integrity, edema, etc.): Pt requesting to tell RN that he "did not want to be bothered every 15 min." Notified RN at end of session.     Exercises     Assessment/Plan    PT Assessment Patient needs continued PT services  PT Problem List Decreased mobility;Decreased activity tolerance;Pain;Decreased knowledge of precautions       PT Treatment Interventions Gait training;Stair training;Functional mobility training;Therapeutic activities;Therapeutic exercise;Patient/family education    PT Goals (Current goals can be found in the Care Plan section)  Acute Rehab PT Goals Patient Stated Goal: to go home PT Goal Formulation: With patient Time For Goal Achievement: 07/31/19 Potential to Achieve Goals: Good    Frequency Min 5X/week   Barriers to discharge        Co-evaluation               AM-PAC PT "6 Clicks" Mobility  Outcome Measure Help needed turning from your back to your side while in a flat bed without using bedrails?: None Help needed moving from lying on your back to sitting on the side of a flat bed without using bedrails?: None Help needed moving to and from a bed to a chair (including a wheelchair)?: A Little Help needed standing up from a chair using your arms (e.g., wheelchair or bedside chair)?: A Little Help needed to walk in hospital room?: A Little Help needed climbing 3-5 steps with a railing? : A Little 6 Click Score: 20    End of Session Equipment Utilized During Treatment: Back brace Activity Tolerance: Patient tolerated treatment well Patient left: in bed;with call bell/phone within reach Nurse Communication: Mobility status PT Visit Diagnosis: Other abnormalities of gait and mobility (R26.89)    Time:  9528-4132 PT Time Calculation (min) (ACUTE ONLY): 13 min   Charges:   PT Evaluation $PT Eval Low Complexity: 1 Low          Lou Miner, DPT  Acute Rehabilitation Services  Pager: [phone] Office: [phone]   Rudean Hitt 07/17/2019, 6:14 PM

## 2019-07-17 NOTE — Anesthesia Procedure Notes (Signed)
Procedure Name: Intubation Date/Time: 07/17/2019 7:35 AM Performed by: Lovie Chol, CRNA Pre-anesthesia Checklist: Patient identified, Emergency Drugs available, Suction available and Patient being monitored Patient Re-evaluated:Patient Re-evaluated prior to induction Oxygen Delivery Method: Circle System Utilized Preoxygenation: Pre-oxygenation with 100% oxygen Induction Type: IV induction Ventilation: Mask ventilation without difficulty Laryngoscope Size: Miller and 3 Grade View: Grade I Tube type: Oral Tube size: 8.0 mm Number of attempts: 1 Airway Equipment and Method: Stylet and Oral airway Placement Confirmation: ETT inserted through vocal cords under direct vision,  positive ETCO2 and breath sounds checked- equal and bilateral Secured at: 23 cm Tube secured with: Tape Dental Injury: Teeth and Oropharynx as per pre-operative assessment

## 2019-07-17 NOTE — Progress Notes (Signed)
Orthopedic Tech Progress Note Patient Details:  Brian Franco [birth year] [id number] Patient has brace. Patient ID: Brian Franco, male   DOB: [birth year], 64 y.o.   MRN: [id number]   Donald Pore 07/17/2019, 12:14 PM

## 2019-07-17 NOTE — Brief Op Note (Signed)
07/17/2019  10:50 AM  PATIENT:  Brian Franco  64 y.o. male  PRE-OPERATIVE DIAGNOSIS:  Spinal stenosis, Lumbar region with neurogenic claudication; lumbago, radiculopathy, Bilateral carpal tunnel syndrome  POST-OPERATIVE DIAGNOSIS:  Spinal stenosis, Lumbar region with neurogenic claudication; lumbago, radiculopathy, Bilateral carpal tunnel syndrome  PROCEDURE:  Procedure(s): Exploration of Lumbar Fusion, posterior decompression at Lumbar three-four with pedicle screw fixation at Lumbar three through four and posterolateral arthrodesis (N/A) Right carpal tunnel release (Right)  SURGEON:  Surgeon(s) and Role:    Maeola Harman, MD - Primary  PHYSICIAN ASSISTANT:   ASSISTANTS: Poteat, RN   ANESTHESIA:   general  EBL:  150 mL   BLOOD ADMINISTERED:none  DRAINS: none   LOCAL MEDICATIONS USED:  MARCAINE    and LIDOCAINE   SPECIMEN:  No Specimen  DISPOSITION OF SPECIMEN:  N/A  COUNTS:  YES  TOURNIQUET:  * No tourniquets in log *  DICTATION: Patient is 64 year old man with lumbar scoliosis and previous fusion L 4 - S 1 levels with stenosis at  L 3/4 after anterolateral decompression and lateral plating at L 34 with severe spinal stenosis and bilateral leg pain.  It was elected to take him to surgery for exploration of previous fusion with decompression and fusion from L 3 through L 4 levels.   Procedure: Patient was placed in a prone position on the [place] table after smooth and uncomplicated induction of general endotracheal anesthesia. Preop localizing radiographs were obtained with LessRay.  His low back was prepped and draped in usual sterile fashion with betadine scrub and DuraPrep. Area of incision was infiltrated with local lidocaine. Incision was made to the lumbodorsal fascia was incised and exposure was performed of the L 3 - L 4 spinous processes laminae facet joint and transverse processes. Previous hardware was exposed.  This was covered with  bone growth and required a  chisel to remove the bone to expose the previously placed hardware. Connectors were placed between the L 4 and L 5 screws bilaterally and locked down in situ.    Intraoperative x-ray was obtained which confirmed correct orientation with marker probes at L3.    Intraoperative fluoroscopy confirmed correct orientationin the AP and lateral plane. 50 x 6.5 mm pedicle screws were placed at L 3 bilaterally. A total laminectomy of L 3 through L 4  levels was performed with disarticulation of the facet joints and thorough decompression was performed of both  L 3  L 4  nerve roots along with the common dural tube. The posterolateral region was extensively decorticated. A 65 mm lordotic rod was placed on the right and a 60 mm rod was placed on the left locked down in situ and the posterolateral region was packed with 20 cc bone graft on the right and  A similar amount on the left with extra extra small BMP. The wound was irrigated.  Fascia was closed with 1 Vicryl sutures skin edges were reapproximated 2 and 3-0 Vicryl sutures. The wound was dressed with Dermabond and  an occlusive dressing the patient was repositioned supine on the OR table for right carpal tunnel release.   His hand and arm were prepped and draped with betadine scrub and paint and sterile stockinet.  Right wrist was infiltrated with lidocaine and an incision was made over a length of 2 cm in line with the fourth ray.  The flexor retinaculum was incised and the carpal tunnel was decompressed.  Decompression was carried into the volar wrist and into the  distal palm.  Hemostasis was assured. The wound was irrigated and closed with 3-0 nylon vertical mattress stitches and dressed with a sterile occlusive dressing with Adaptic, fluff gauze, Kerlix and cling wrap. Patient was taken to recovery in stable and satisfactory condition. Counts were correct at the end of the case.   PLAN OF CARE: Admit to inpatient   PATIENT DISPOSITION:  PACU - hemodynamically  stable.   Delay start of Pharmacological VTE agent (>24hrs) due to surgical blood loss or risk of bleeding: yes  

## 2019-07-17 NOTE — Transfer of Care (Signed)
Immediate Anesthesia Transfer of Care Note  Patient: Brian Franco  Procedure(s) Performed: Exploration of Lumbar five Sacral one Fusion, posterior decompression at Lumbar three-four with pedicle screw fixation at Lumbar three (N/A Back) Right carpal tunnel release (Right Hand)  Patient Location: PACU  Anesthesia Type:General  Level of Consciousness: awake, oriented and patient cooperative  Airway & Oxygen Therapy: Patient Spontanous Breathing and Patient connected to face mask oxygen  Post-op Assessment: Report given to RN and Post -op Vital signs reviewed and stable  Post vital signs: Reviewed  Last Vitals:  Vitals Value Taken Time  BP 162/82 07/17/19 1102  Temp 36.4 C 07/17/19 1100  Pulse 69 07/17/19 1102  Resp 22 07/17/19 1102  SpO2 100 % 07/17/19 1102  Vitals shown include unvalidated device data.  Last Pain:  Vitals:   07/17/19 0638  TempSrc:   PainSc: 7       Patients Stated Pain Goal: 3 (07/17/19 2518)  Complications: No apparent anesthesia complications

## 2019-07-17 NOTE — Op Note (Signed)
07/17/2019  10:50 AM  PATIENT:  Brian Franco  64 y.o. male  PRE-OPERATIVE DIAGNOSIS:  Spinal stenosis, Lumbar region with neurogenic claudication; lumbago, radiculopathy, Bilateral carpal tunnel syndrome  POST-OPERATIVE DIAGNOSIS:  Spinal stenosis, Lumbar region with neurogenic claudication; lumbago, radiculopathy, Bilateral carpal tunnel syndrome  PROCEDURE:  Procedure(s): Exploration of Lumbar Fusion, posterior decompression at Lumbar three-four with pedicle screw fixation at Lumbar three through four and posterolateral arthrodesis (N/A) Right carpal tunnel release (Right)  SURGEON:  Surgeon(s) and Role:    Maeola Harman, MD - Primary  PHYSICIAN ASSISTANT:   ASSISTANTS: Poteat, RN   ANESTHESIA:   general  EBL:  150 mL   BLOOD ADMINISTERED:none  DRAINS: none   LOCAL MEDICATIONS USED:  MARCAINE    and LIDOCAINE   SPECIMEN:  No Specimen  DISPOSITION OF SPECIMEN:  N/A  COUNTS:  YES  TOURNIQUET:  * No tourniquets in log *  DICTATION: Patient is 64 year old man with lumbar scoliosis and previous fusion L 4 - S 1 levels with stenosis at  L 3/4 after anterolateral decompression and lateral plating at L 34 with severe spinal stenosis and bilateral leg pain.  It was elected to take him to surgery for exploration of previous fusion with decompression and fusion from L 3 through L 4 levels.   Procedure: Patient was placed in a prone position on the [place] table after smooth and uncomplicated induction of general endotracheal anesthesia. Preop localizing radiographs were obtained with LessRay.  His low back was prepped and draped in usual sterile fashion with betadine scrub and DuraPrep. Area of incision was infiltrated with local lidocaine. Incision was made to the lumbodorsal fascia was incised and exposure was performed of the L 3 - L 4 spinous processes laminae facet joint and transverse processes. Previous hardware was exposed.  This was covered with  bone growth and required a  chisel to remove the bone to expose the previously placed hardware. Connectors were placed between the L 4 and L 5 screws bilaterally and locked down in situ.    Intraoperative x-ray was obtained which confirmed correct orientation with marker probes at L3.    Intraoperative fluoroscopy confirmed correct orientationin the AP and lateral plane. 50 x 6.5 mm pedicle screws were placed at L 3 bilaterally. A total laminectomy of L 3 through L 4  levels was performed with disarticulation of the facet joints and thorough decompression was performed of both  L 3  L 4  nerve roots along with the common dural tube. The posterolateral region was extensively decorticated. A 65 mm lordotic rod was placed on the right and a 60 mm rod was placed on the left locked down in situ and the posterolateral region was packed with 20 cc bone graft on the right and  A similar amount on the left with extra extra small BMP. The wound was irrigated.  Fascia was closed with 1 Vicryl sutures skin edges were reapproximated 2 and 3-0 Vicryl sutures. The wound was dressed with Dermabond and  an occlusive dressing the patient was repositioned supine on the OR table for right carpal tunnel release.   His hand and arm were prepped and draped with betadine scrub and paint and sterile stockinet.  Right wrist was infiltrated with lidocaine and an incision was made over a length of 2 cm in line with the fourth ray.  The flexor retinaculum was incised and the carpal tunnel was decompressed.  Decompression was carried into the volar wrist and into the  distal palm.  Hemostasis was assured. The wound was irrigated and closed with 3-0 nylon vertical mattress stitches and dressed with a sterile occlusive dressing with Adaptic, fluff gauze, Kerlix and cling wrap. Patient was taken to recovery in stable and satisfactory condition. Counts were correct at the end of the case.   PLAN OF CARE: Admit to inpatient   PATIENT DISPOSITION:  PACU - hemodynamically  stable.   Delay start of Pharmacological VTE agent (>24hrs) due to surgical blood loss or risk of bleeding: yes

## 2019-07-17 NOTE — Anesthesia Postprocedure Evaluation (Signed)
Anesthesia Post Note  Patient: JOESIAH LONON  Procedure(s) Performed: Exploration of Lumbar five Sacral one Fusion, posterior decompression at Lumbar three-four with pedicle screw fixation at Lumbar three (N/A Back) Right carpal tunnel release (Right Hand)     Patient location during evaluation: PACU Anesthesia Type: General Level of consciousness: awake and alert Pain management: pain level controlled Vital Signs Assessment: post-procedure vital signs reviewed and stable Respiratory status: spontaneous breathing, nonlabored ventilation, respiratory function stable and patient connected to nasal cannula oxygen Cardiovascular status: blood pressure returned to baseline and stable Postop Assessment: no apparent nausea or vomiting Anesthetic complications: no    Last Vitals:  Vitals:   07/17/19 1208 07/17/19 1540  BP: (!) 154/87 (!) 143/69  Pulse: (!) 57 64  Resp: 20 18  Temp: 36.5 C 37 C  SpO2: 92% 92%    Last Pain:  Vitals:   07/17/19 1540  TempSrc: Oral  PainSc:                  Imonie Tuch DAVID

## 2019-07-18 ENCOUNTER — Encounter: Payer: Self-pay | Admitting: *Deleted

## 2019-07-18 MED ORDER — OXYCODONE HCL 10 MG PO TABS: 10.0000 mg | ORAL_TABLET | ORAL | 0 refills | Status: DC | PRN

## 2019-07-18 NOTE — Progress Notes (Addendum)
Subjective: Patient reports "I'm glad to be walking normal again!"  Objective: Vital signs in last 24 hours: Temp:  [97.6 F (36.4 C)-98.6 F (37 C)] 98.3 F (36.8 C) (03/10 0539) Pulse Rate:  [50-68] 54 (03/10 0539) Resp:  [7-21] 20 (03/10 0539) BP: (118-171)/(53-87) 141/73 (03/10 0539) SpO2:  [91 %-100 %] 95 % (03/10 0539)  Intake/Output from previous day: 03/09 0701 - 03/10 0700 In: 2650 [P.O.:600; I.V.:2000; IV Piggyback:50] Out: 750 [Urine:600; Blood:150] Intake/Output this shift: No intake/output data recorded.  Alert, conversant, sitting on edge of bed. Good strength BLE. Lumbar incision without erythema beneath honeycomb and dermabond. Reports minimal lumbar pain, no leg pain or numbness. Moving fingers of right hand with good strength . No pain or numbness right hand. Drsg intact, dry.  Lab Results: No results for input(s): WBC, HGB, HCT, PLT in the last 72 hours. BMET No results for input(s): NA, K, CL, CO2, GLUCOSE, BUN, CREATININE, CALCIUM in the last 72 hours.  Studies/Results: DG Lumbar Spine 2-3 Views  Result Date: 07/17/2019 CLINICAL DATA:  Extension of existing fusion. EXAM: DG C-ARM 1-60 MIN; LUMBAR SPINE - 2-3 VIEW FLUOROSCOPY TIME:  Fluoroscopy Time:  22 seconds Number of Acquired Spot Images: AP and lateral spot films, 2 total images. COMPARISON:  05/21/2019 FINDINGS: AP and lateral spot images show interval placement of posterior approach pedicle screws at the L3 level. Localization based on location of previous lateral and posterior fusion changes. There is minimal retrolisthesis of L3 on L4 that is similar to the previous exam. The posterior rods do not yet extend to the level of pedicle screws along the posterior margin of fusion. L3-4 lateral fusion/fixation hardware with similar appearance, the lower screw is partially obscured by retractors and overlapping posterior fusion changes. Interbody device noted at the L4-5 levelw and partially visualized at L5-S1.  IMPRESSION: Intraoperative spot views with retractors in place limiting assessment showing interval placement of pedicle screws into the L3 level. Rod fusion does not yet extend to this level on submitted images. Electronically Signed   By: Donzetta Kohut M.D.   On: 07/17/2019 09:36   DG C-Arm 1-60 Min  Result Date: 07/17/2019 CLINICAL DATA:  Extension of existing fusion. EXAM: DG C-ARM 1-60 MIN; LUMBAR SPINE - 2-3 VIEW FLUOROSCOPY TIME:  Fluoroscopy Time:  22 seconds Number of Acquired Spot Images: AP and lateral spot films, 2 total images. COMPARISON:  05/21/2019 FINDINGS: AP and lateral spot images show interval placement of posterior approach pedicle screws at the L3 level. Localization based on location of previous lateral and posterior fusion changes. There is minimal retrolisthesis of L3 on L4 that is similar to the previous exam. The posterior rods do not yet extend to the level of pedicle screws along the posterior margin of fusion. L3-4 lateral fusion/fixation hardware with similar appearance, the lower screw is partially obscured by retractors and overlapping posterior fusion changes. Interbody device noted at the L4-5 levelw and partially visualized at L5-S1. IMPRESSION: Intraoperative spot views with retractors in place limiting assessment showing interval placement of pedicle screws into the L3 level. Rod fusion does not yet extend to this level on submitted images. Electronically Signed   By: Donzetta Kohut M.D.   On: 07/17/2019 09:36    Assessment/Plan: improved  LOS: 1 day  Ok per Dr. Venetia Maxon to d/c to home. Pt verbalizes understanding of d/c instructions. Will return to office in 2 weeks for suture removal. Rx for Oxycodone 10mg  will e eRx'ed to his pharmacy. He has Robaxin  at home for prn use.   Verdis Prime 07/18/2019, 7:15 AM  Patient is doing well.  Discharge home.

## 2019-07-18 NOTE — Plan of Care (Signed)
Patient alert and oriented, mae's well, voiding adequate amount of urine, swallowing without difficulty, no c/o pain at time of discharge. Patient discharged home with family. Script and discharged instructions given to patient. Patient and family stated understanding of instructions given. Patient has an appointment with Dr.Stern    

## 2019-07-18 NOTE — Discharge Summary (Signed)
Physician Discharge Summary  Patient ID: Brian Franco MRN: [id number] DOB/AGE: 62-Sep-1957 62 y.o.  Admit date: 07/17/2019 Discharge date: 07/18/2019  Admission Diagnoses: Spinal stenosis, Lumbar region with neurogenic claudication; lumbago, radiculopathy, Bilateral carpal tunnel syndrome    Discharge Diagnoses: Spinal stenosis, Lumbar region with neurogenic claudication; lumbago, radiculopathy, Bilateral carpal tunnel syndrome s/p Exploration of Lumbar Fusion, posterior decompression at Lumbar three-four with pedicle screw fixation at Lumbar three through four and posterolateral arthrodesis (N/A) Right carpal tunnel release (Right)     Active Problems:   Spinal stenosis of lumbar region with radiculopathy   Discharged Condition: good  Hospital Course: Brian Franco was admitted for surgeries with dx stenosis, radiculopathy and right carpal tunnel syndrome. Following uncomplicated surgeries (above) he recovered nicely and transferred to [facility] for nursing care and therapies. He is mobilizing well.  Consults: None  Significant Diagnostic Studies: radiology: X-Ray: intra-op  Treatments: surgery: Exploration of Lumbar Fusion, posterior decompression at Lumbar three-four with pedicle screw fixation at Lumbar three through four and posterolateral arthrodesis (N/A) Right carpal tunnel release (Right)    Discharge Exam: Blood pressure (!) 141/73, pulse (!) 54, temperature 98.3 F (36.8 C), temperature source Oral, resp. rate 20, height 6\' 4"  (1.93 m), weight 113.4 kg, SpO2 95 %. Alert, conversant, sitting on edge of bed. Good strength BLE. Lumbar incision without erythema beneath honeycomb and dermabond. Reports minimal lumbar pain, no leg pain or numbness. Moving fingers of right hand with good strength . No pain or numbness right hand. Drsg intact, dry.    Disposition:  Discharge to home. Pt verbalizes understanding of d/c instructions. Will return to office in 2  weeks for suture removal. Rx for Oxycodone 10mg  will e eRx'ed to his pharmacy. He has Robaxin at home for prn use.     Allergies as of 07/18/2019   No Known Allergies     Medication List    STOP taking these medications   ibuprofen 800 MG tablet Commonly known as: ADVIL     TAKE these medications   CeleXA 20 MG tablet Generic drug: citalopram Take 20 mg by mouth daily.   fenofibrate 160 MG tablet Take 160 mg by mouth daily.   HYDROcodone-acetaminophen 10-325 MG tablet Commonly known as: NORCO Take 1-2 tablets by mouth every 4 (four) hours as needed for moderate pain ((score 7 to 10)).   methocarbamol 500 MG tablet Commonly known as: ROBAXIN Take 1 tablet (500 mg total) by mouth every 6 (six) hours as needed for muscle spasms.   methocarbamol 750 MG tablet Commonly known as: ROBAXIN Take 750 mg by mouth in the morning and at bedtime.   Oxycodone HCl 10 MG Tabs Take 1 tablet (10 mg total) by mouth every 4 (four) hours as needed ((score 7 to 10)).   Percocet 7.5-325 MG tablet Generic drug: oxyCODONE-acetaminophen Take 1 tablet by mouth 4 (four) times daily as needed (severe pain).        Signed: 09/17/2019, MD 07/18/2019, 8:17 AM

## 2019-07-18 NOTE — Progress Notes (Signed)
Physical Therapy Treatment Patient Details Name: Brian Franco MRN: [id number] DOB: Aug 27, 1955 Today's Date: 07/18/2019    History of Present Illness Pt is a 64 y/o male s/p L3-4 PLIF and R carpal tunnel release. PMH includes smoker, sleep apnea.     PT Comments    Pt eager to leave. Pt function at supervision for safety and to adhere to back precautions. Pt able to amb without AD and navigate stairs safely. Acute PT to cont to follow.   Follow Up Recommendations  No PT follow up     Equipment Recommendations  None recommended by PT    Recommendations for Other Services       Precautions / Restrictions Precautions Precautions: Back Precaution Booklet Issued: Yes (comment) Precaution Comments: pt unable to recall, agitated and wants to go home, pt and spouse re-educated on precautions Required Braces or Orthoses: Spinal Brace Spinal Brace: Lumbar corset Restrictions Weight Bearing Restrictions: No    Mobility  Bed Mobility Overal bed mobility: Needs Assistance             General bed mobility comments: pt sitting EOB upon PT arrival, pt reports "rolling" in/out of bed  Transfers Overall transfer level: Modified independent   Transfers: Sit to/from Stand Sit to Stand: Modified independent (Device/Increase time)         General transfer comment: verbal cues to minimize bending and choose higher level surface heights to sit in  Ambulation/Gait Ambulation/Gait assistance: Supervision Gait Distance (Feet): 200 Feet Assistive device: None Gait Pattern/deviations: Step-through pattern;Decreased stride length Gait velocity: Decreased Gait velocity interpretation: 1.31 - 2.62 ft/sec, indicative of limited community ambulator General Gait Details: pt with trunk flexion, pt unable to achieve full upright position, no episodes of LOB   Stairs Stairs: Yes Stairs assistance: Min guard Stair Management: One rail Left;Alternating pattern Number of Stairs:  10 General stair comments: pt with safe navigation   Wheelchair Mobility    Modified Rankin (Stroke Patients Only)       Balance Overall balance assessment: Mild deficits observed, not formally tested                                          Cognition Arousal/Alertness: Awake/alert Behavior During Therapy: Restless Overall Cognitive Status: Within Functional Limits for tasks assessed                                 General Comments: irritated and wants to go home      Exercises Other Exercises Other Exercises: edema management with ice and elevation to RUE, continue to wiggle digits    General Comments General comments (skin integrity, edema, etc.): pt spouse present with verbal understanding of precautions, pt with R hand dressing      Pertinent Vitals/Pain Pain Assessment: 0-10 Pain Score: 4  Pain Location: low back and R hand Pain Descriptors / Indicators: Discomfort;Sore Pain Intervention(s): Monitored during session    Home Living Family/patient expects to be discharged to:: Private residence Living Arrangements: Spouse/significant other Available Help at Discharge: Family;Available 24 hours/day Type of Home: House Home Access: Stairs to enter Entrance Stairs-Rails: None Home Layout: One level Home Equipment: Bedside commode;Shower seat - built in      Prior Function Level of Independence: Independent          PT Goals (current goals can  now be found in the care plan section) Acute Rehab PT Goals Patient Stated Goal: to go home Progress towards PT goals: Goals met/education completed, patient discharged from PT    Frequency    Min 5X/week      PT Plan Current plan remains appropriate    Co-evaluation              AM-PAC PT "6 Clicks" Mobility   Outcome Measure  Help needed turning from your back to your side while in a flat bed without using bedrails?: None Help needed moving from lying on your back  to sitting on the side of a flat bed without using bedrails?: None Help needed moving to and from a bed to a chair (including a wheelchair)?: None Help needed standing up from a chair using your arms (e.g., wheelchair or bedside chair)?: None Help needed to walk in hospital room?: None Help needed climbing 3-5 steps with a railing? : A Little 6 Click Score: 23    End of Session Equipment Utilized During Treatment: Back brace Activity Tolerance: Patient tolerated treatment well Patient left: in bed;with call bell/phone within reach;with family/visitor present(sitting EOB) Nurse Communication: Mobility status PT Visit Diagnosis: Other abnormalities of gait and mobility (R26.89)     Time: 4469-5072 PT Time Calculation (min) (ACUTE ONLY): 10 min  Charges:  $Gait Training: 8-22 mins                     Kittie Plater, PT, DPT Acute Rehabilitation Services Pager #: [phone] Office #: [phone]    Berline Lopes 07/18/2019, 9:46 AM

## 2019-07-18 NOTE — Discharge Instructions (Signed)

## 2019-07-18 NOTE — Evaluation (Signed)
Occupational Therapy Evaluation Patient Details Name: Brian Franco MRN: [id number] DOB: [birth year] Today's Date: 07/18/2019    History of Present Illness Pt is a 64 y/o male s/p L3-4 PLIF and R carpal tunnel release. PMH includes smoker, sleep apnea.    Clinical Impression   Pt PTA: lives with spouse and independent prior. Pt currently performing transfers and mobility with independence. Pt may need assist for RUE wrapped to MCPs and no excessive gripping or strenuous use yet. Pt continues to be able to perform LB ADL with figure four technique.  Car transfer education performed. Back handout provided and reviewed ADL in detail. Pt educated on: clothing between brace, never sleep in brace, set an alarm at night for medication, avoid sitting for long periods of time, correct bed positioning for sleeping, correct sequence for bed mobility, avoiding lifting more than 5 pounds and never wash directly over incision. All education is complete and patient indicates understanding. Pt does not require continued OT skilled services. OT signing off.     Follow Up Recommendations  Follow surgeon's recommendation for DC plan and follow-up therapies    Equipment Recommendations  None recommended by OT    Recommendations for Other Services       Precautions / Restrictions Precautions Precautions: Back Precaution Booklet Issued: Yes (comment) Precaution Comments: Pt stating 3 back precautions Required Braces or Orthoses: Spinal Brace Spinal Brace: Lumbar corset(pt had alraedy applied) Restrictions Weight Bearing Restrictions: No      Mobility Bed Mobility Overal bed mobility: Needs Assistance             General bed mobility comments: verbal discussion of log roll  Transfers Overall transfer level: Needs assistance   Transfers: Sit to/from Stand Sit to Stand: Modified independent (Device/Increase time)              Balance Overall balance assessment: No apparent balance  deficits (not formally assessed)                                         ADL either performed or assessed with clinical judgement   ADL Overall ADL's : Modified independent                                       General ADL Comments: Pt may need assist for RUE wrapped to MCPs and no excessive gripping or strenuous use yet. Pt continues to be able to perform LB ADL with figure four technique.      Vision Baseline Vision/History: No visual deficits Patient Visual Report: No change from baseline Vision Assessment?: No apparent visual deficits     Perception     Praxis      Pertinent Vitals/Pain Pain Assessment: 0-10 Pain Score: 3  Pain Location: low back and R hand Pain Descriptors / Indicators: Discomfort;Sore Pain Intervention(s): Monitored during session;Premedicated before session     Hand Dominance Right   Extremity/Trunk Assessment Upper Extremity Assessment Upper Extremity Assessment: RUE deficits/detail RUE Deficits / Details: RUE wrapped to MCPs. Pt wiggling digits andelevation education performed. RUE: Unable to fully assess due to immobilization RUE Coordination: decreased fine motor   Lower Extremity Assessment Lower Extremity Assessment: Overall WFL for tasks assessed   Cervical / Trunk Assessment Cervical / Trunk Assessment: Other exceptions Cervical / Trunk Exceptions: s/p lumbar surgery  Communication Communication Communication: No difficulties   Cognition Arousal/Alertness: Awake/alert Behavior During Therapy: WFL for tasks assessed/performed Overall Cognitive Status: Within Functional Limits for tasks assessed                                     General Comments  Pt's spouse entering to conclude OT session. Pt was pleasant this session.    Exercises Exercises: Other exercises Other Exercises Other Exercises: edema management with ice and elevation to RUE, continue to wiggle digits   Shoulder  Instructions      Home Living Family/patient expects to be discharged to:: Private residence Living Arrangements: Spouse/significant other Available Help at Discharge: Family;Available 24 hours/day Type of Home: House Home Access: Stairs to enter [employer] of Steps: 1 Entrance Stairs-Rails: None Home Layout: One level     Bathroom Shower/Tub: Occupational psychologist: Standard     Home Equipment: Bedside commode;Shower seat - built in          Prior Functioning/Environment Level of Independence: Independent                 OT Problem List: Decreased strength;Decreased activity tolerance;Increased edema;Impaired UE functional use      OT Treatment/Interventions:      OT Goals(Current goals can be found in the care plan section) Acute Rehab OT Goals Patient Stated Goal: to go home  OT Frequency:     Barriers to D/C:            Co-evaluation              AM-PAC OT "6 Clicks" Daily Activity     Outcome Measure Help from another person eating meals?: None Help from another person taking care of personal grooming?: None Help from another person toileting, which includes using toliet, bedpan, or urinal?: None Help from another person bathing (including washing, rinsing, drying)?: None Help from another person to put on and taking off regular upper body clothing?: None Help from another person to put on and taking off regular lower body clothing?: None 6 Click Score: 24   End of Session Nurse Communication: Mobility status  Activity Tolerance: Patient tolerated treatment well Patient left: in bed;with call bell/phone within reach;with family/visitor present  OT Visit Diagnosis: Muscle weakness (generalized) (M62.81);Pain Pain - Right/Left: Right Pain - part of body: Hand                Time: 7654-6503 OT Time Calculation (min): 18 min Charges:  OT General Charges $OT Visit: 1 Visit OT Evaluation $OT Eval Moderate Complexity: 1  Mod  Jefferey Pica, OTR/L Acute Rehabilitation Services Pager: [phone] Office: [phone]  Yadira Hada C 07/18/2019, 7:53 AM

## 2019-07-20 MED FILL — Sodium Chloride IV Soln 0.9%: INTRAVENOUS | Qty: 1000 | Status: AC

## 2019-07-20 MED FILL — Heparin Sodium (Porcine) Inj 1000 Unit/ML: INTRAMUSCULAR | Qty: 30 | Status: AC

## 2020-06-18 ENCOUNTER — Other Ambulatory Visit: Payer: Self-pay | Admitting: Neurosurgery

## 2020-06-18 DIAGNOSIS — M5416 Radiculopathy, lumbar region: Secondary | ICD-10-CM

## 2020-07-10 ENCOUNTER — Other Ambulatory Visit: Payer: BC Managed Care – PPO

## 2020-10-02 ENCOUNTER — Other Ambulatory Visit: Payer: Self-pay | Admitting: Neurosurgery

## 2020-11-08 NOTE — H&P (Signed)
Patient ID:   [id number] Patient: Brian Franco  Date of Birth: 1955/07/22 Visit Type: Office Visit   Date: 10/02/2020 09:15 AM Provider: Danae Orleans. Venetia Maxon MD   This 65 year old male presents for back pain.  HISTORY OF PRESENT ILLNESS:  1.  back pain  09/15/2020 L2-3 interlaminar ESI by Dr. Lorrine Kin offered 5 days relief 07/22/2020 L2-3 interlaminar ESI by Dr. Lorrine Kin offered 10 days relief 2021? Lumbar injection with another physician caused increased pain  Patient returns reporting gradually worsening pain left buttock, left thigh with standing or walking.  He reports having to sit to rest after 50 steps.  Symptoms began 1 week after shoveling snow in February.  Endocet 7.5/325 mg taken q.i.d. Ibuprofen 800 mg taken q.i.d. Cyclobenzaprine 10mg  taken q.i.d.  The patient is continuing to complain of severe pain which he grades at 8/10 in severity.  He is having to take narcotic analgesics at along with anti-inflammatories with incomplete relief of his pain.  We discussed the importance of him stopping smoking.  We also discussed weight control and the need for him to modify his activities so that he does not re-injury his back yet again.  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     Family History:  Reviewed, no changes.    Social History: Reviewed, no changes.   MEDICATIONS: (added, continued or stopped this visit) Started Medication Directions Instruction Stopped   citalopram 20 mg tablet take 1 tablet by oral route  every day    07/14/2020 cyclobenzaprine 10 mg tablet take 1 tablet by oral route 3 times every day    04/18/2019 Endocet 7.5 mg-325 mg tablet take 1 tablet by oral route  every 6 hours as needed     fenofibrate 160 mg tablet take 1 tablet by oral route  every day    08/08/2020 ibuprofen 800 mg tablet take 1 tablet by oral route 3 times every day with food as needed     06/06/2020 Medrol (Pak) 4 mg tablets in a dose pack take by Oral route as directed  10/02/2020  07/20/2019 Robaxin-750  750 mg tablet take 1 tablet by oral route  every 6 hours prn spasm  10/02/2020     ALLERGIES: Ingredient Reaction Medication Name Comment  NO KNOWN ALLERGIES     No known allergies. Reviewed, no changes.    PHYSICAL EXAM:   Vitals Date Temp F BP Pulse Ht In Wt Lb BMI BSA Pain Score  10/02/2020  169/85 54 76 246.6 30.02  8/10      IMPRESSION:   The patient has not had great relief with injections and continues to complain of significant left buttock and left thigh pain which is disabling to him.  He has a significant disc herniation at L2-3 on the left  PLAN:  I have recommended decompression and left TLIF at L2-3 with exploration of prior fusion.  The patient wants to go ahead with surgery.  This is being set up on an expedited basis given him the severity of his pain.  Orders: Diagnostic Procedures: Assessment Procedure  M54.16 Lumbar Spine- AP/Lat  Instruction(s)/Education: Assessment Instruction  R03.0 Lifestyle education  Z68.30 Dietary management education, guidance, and counseling   Completed Orders (this encounter) Order Details Reason Side Interpretation Result Initial Treatment Date Region  Lifestyle education Patient will monitor and contact primary care physician if needed.  Dietary management education, guidance, and counseling Encouraged patient to eat well balanced diet.         Assessment/Plan   # Detail Type Description   1. Assessment Low back pain, unspecified back pain laterality, unspecified chronicity, unspecified whether sciatica present (M54.50).       2. Assessment Bilateral carpal tunnel syndrome (G56.03).       3. Assessment Lumbar radiculopathy (M54.16).       4. 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       5. Assessment Disc displacement, lumbar (M51.26).       6.  Assessment Elevated blood-pressure reading, w/o diagnosis of htn (R03.0).       7. Assessment Body mass index (BMI) 30.0-30.9, adult (Z68.30).   Plan Orders Today's instructions / counseling include(s) Dietary management education, guidance, and counseling. Clinical information/comments: Encouraged patient to eat well balanced diet.         Pain Management Plan Pain Scale: 8/10. Method: Numeric Pain Intensity Scale. Location: back. Onset: 04/21/2017. Duration: varies. Quality: discomforting. Pain management follow-up plan of care: Patient will continue medication management..              Provider:  Danae Orleans. Venetia Maxon MD  10/05/2020 04:24 PM    Dictation edited by: Danae Orleans. Venetia Maxon    CC Providers: Alcide Goodness [facility] [address] [place],  [place]  [address]-   [facility]  [facility] [address] [place], [place] [address]-               Electronically signed by Danae Orleans. Venetia Maxon MD on 10/05/2020 04:24 PM

## 2020-11-22 IMAGING — MR MR LUMBAR SPINE WO/W CM
4 of 7 series · 26 of 48 positions shown · IV contrast (20ml Multihance)
Comparison: MRI lumbar spine 10/12/2018.

CLINICAL DATA: Sharp groin pain radiating into both legs and knees
since October 2018. History of prior lumbar surgery.

EXAM:
MRI LUMBAR SPINE WITHOUT AND WITH CONTRAST
TECHNIQUE: Multiplanar and multiecho pulse sequences of the lumbar spine were
obtained without and with intravenous contrast.
CONTRAST:  20 mL MULTIHANCE GADOBENATE DIMEGLUMINE 529 MG/ML IV SOLN

[Series 2: T1 · sagittal · 4.0mm · 0.55mm/px · 3 of 15 slices shown (1 of 2)]
[im 1/15]
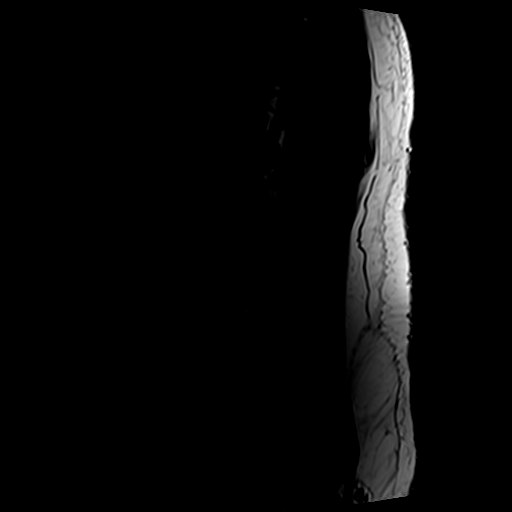
[im 8/15]
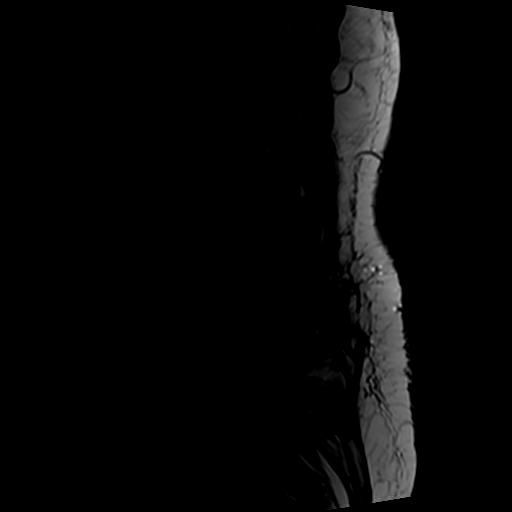
[im 15/15]
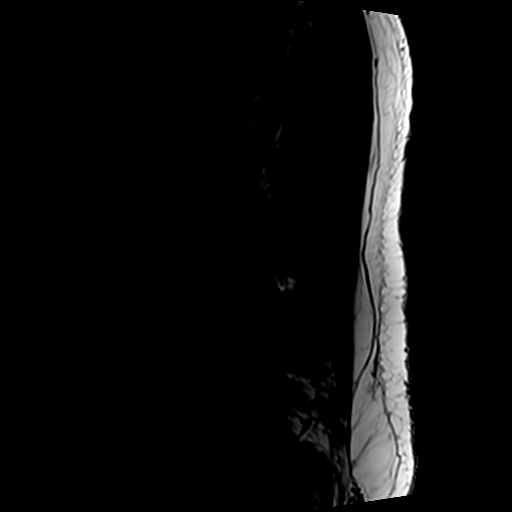

[Series 4: T2 · axial · 4.0mm · 0.78mm/px · z∈[-134,+105]mm · 11 of 39 slices shown (1 of 2)]
[im 1/39]
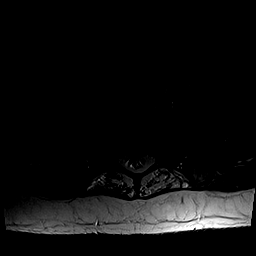
[im 4/39]
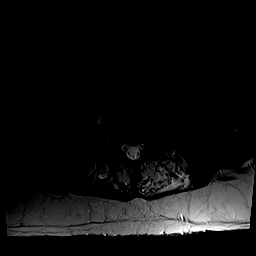
[im 8/39]
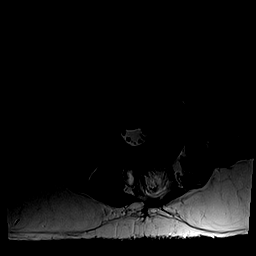
[im 12/39]
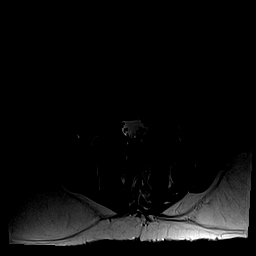
[im 16/39]
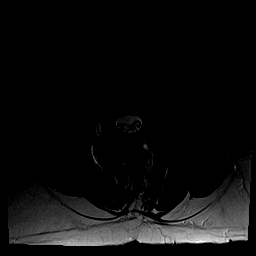
[im 20/39]
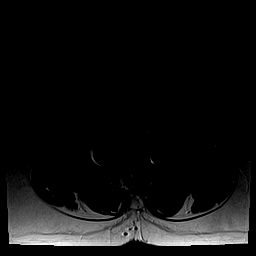
[im 23/39]
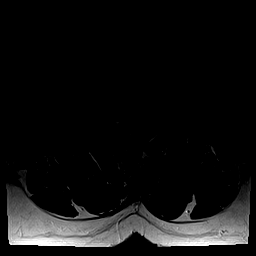
[im 27/39]
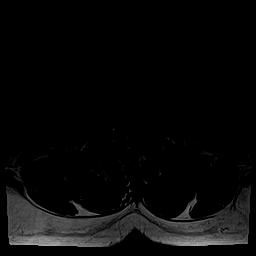
[im 31/39]
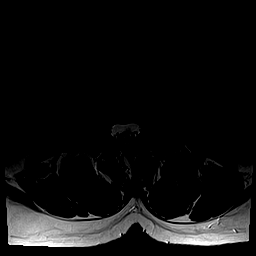
[im 35/39]
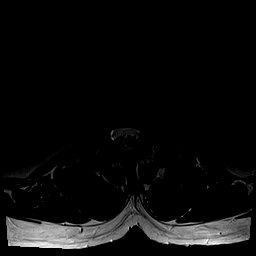
[im 39/39]
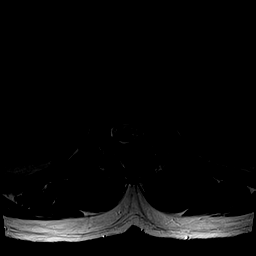

[Series 5: T1 · axial · 4.0mm · 0.39mm/px · z∈[-134,+64]mm · 8 of 39 slices shown (2 of 2)]
[im 1/39]
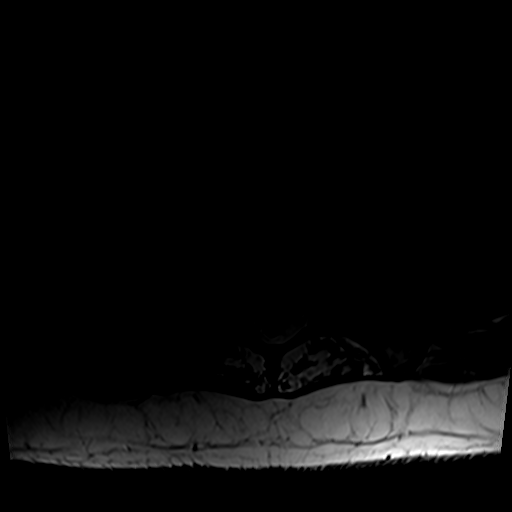
[im 4/39]
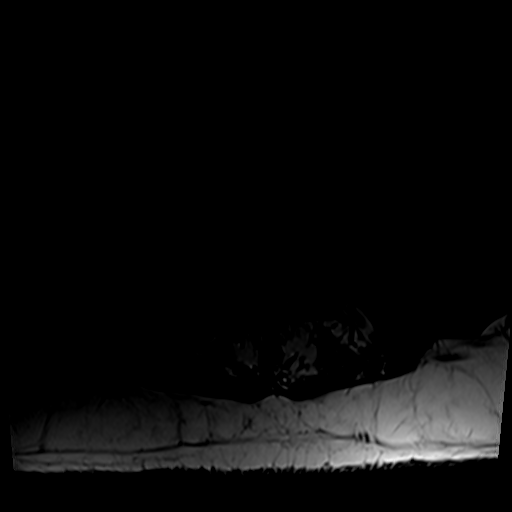
[im 8/39]
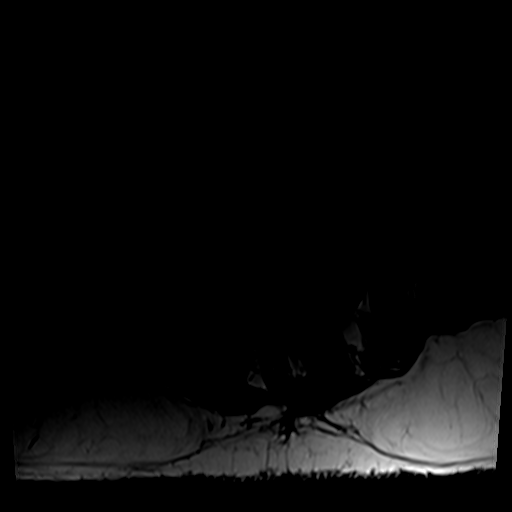
[im 12/39]
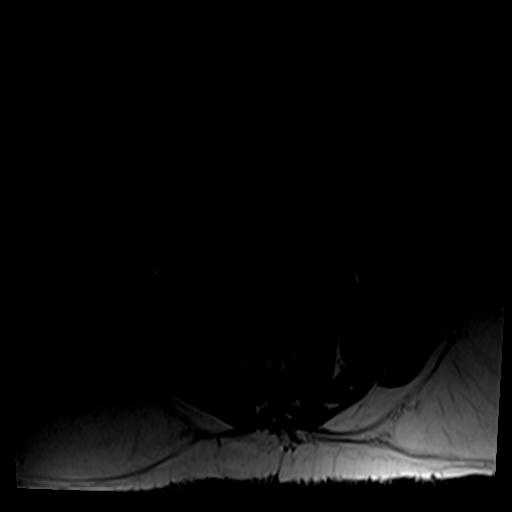
[im 16/39]
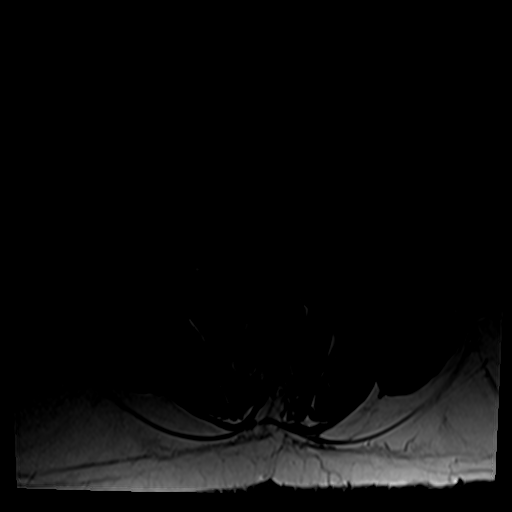
[im 20/39]
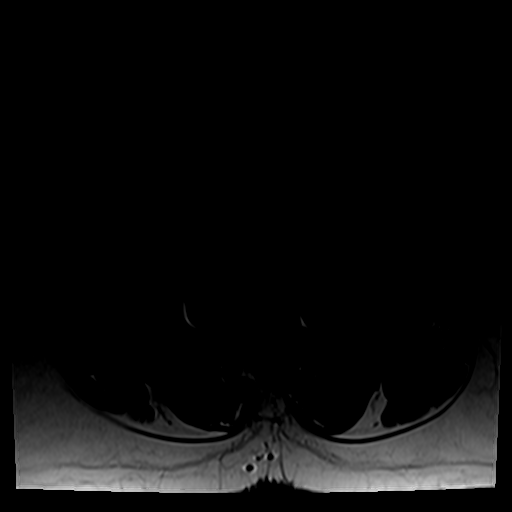
[im 23/39]
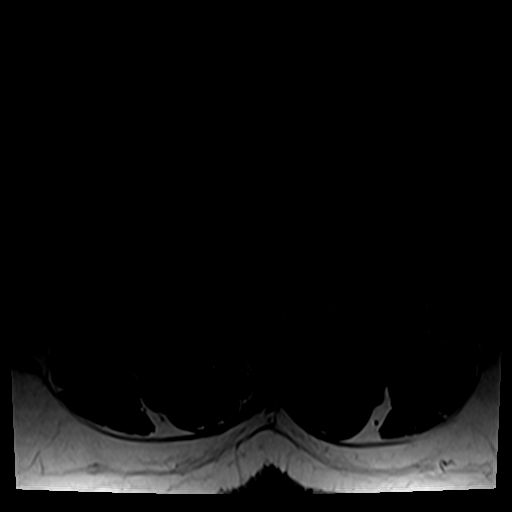
[im 35/39]
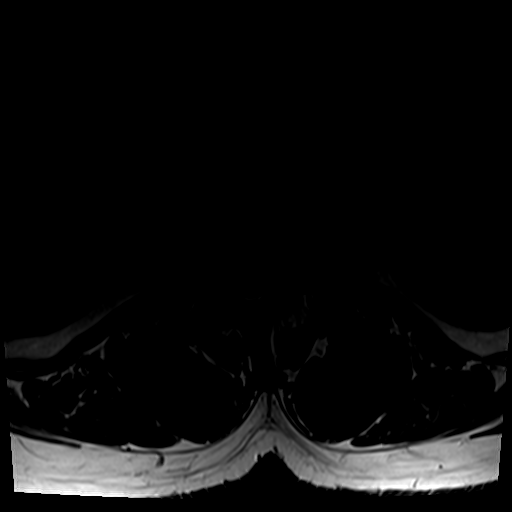

[Series 6: T2 · sagittal · 4.0mm · 0.55mm/px · 4 of 15 slices shown (2 of 2)]
[im 1/15]
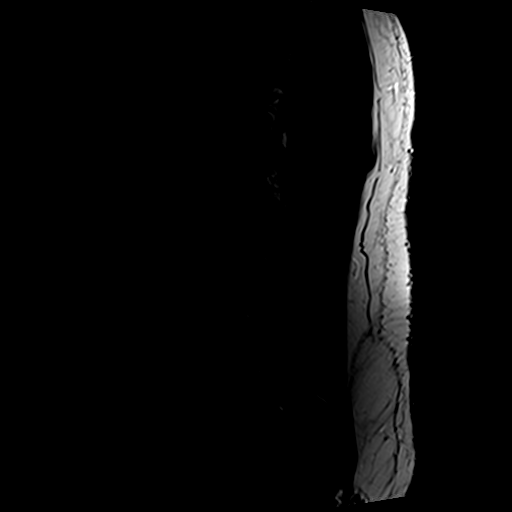
[im 5/15]
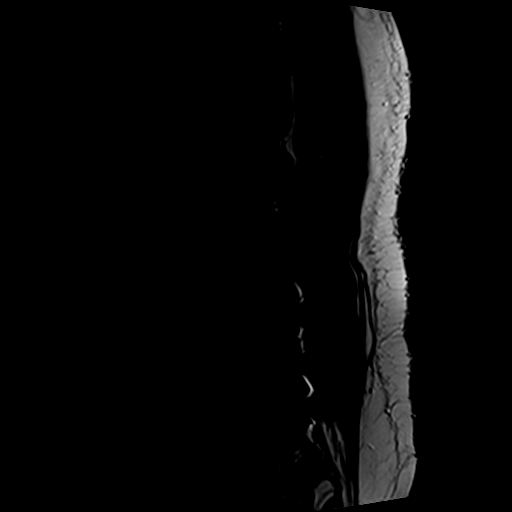
[im 10/15]
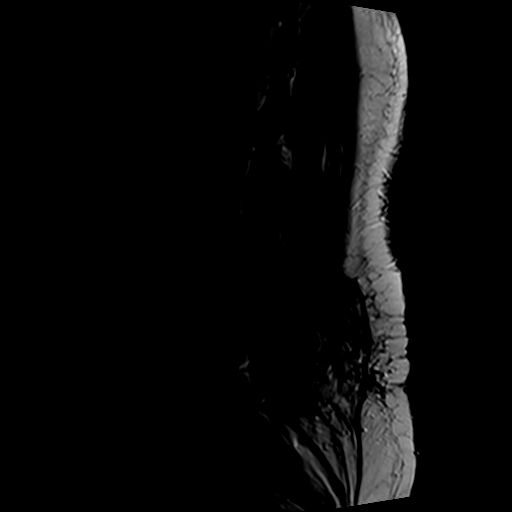
[im 15/15]
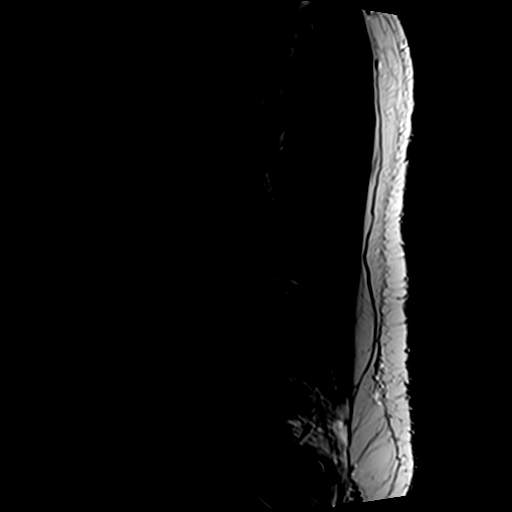

[26 of 48 positions shown; findings below may reference images not displayed]

FINDINGS: Segmentation:  Standard.

Alignment:  Trace retrolisthesis L3 on L4 is unchanged.

Vertebrae: No fracture, evidence of discitis, or bone lesion. Status
post L4-S1 fusion as seen on the prior MRI.

Conus medullaris and cauda equina: Conus extends to the L1 level.
Conus and cauda equina appear normal.

Paraspinal and other soft tissues: Negative.

Disc levels:

T11-12 is imaged in the sagittal plane only. There is a shallow
bulge but no stenosis.

T12-L1: Negative.

L1-2: Minimal disc bulge and mild facet degenerative change without
stenosis.

L2-3: Shallow disc bulge and mild-to-moderate facet degenerative
change. Somewhat prominent dorsal epidural fat is unchanged. There
is mild narrowing of the thecal sac by disc and epidural fat.
Foramina open. No change.

L3-4: Disc bulge, ligamentum flavum thickening and mild facet
degenerative disease. Somewhat prominent dorsal epidural fat is
unchanged. Moderate central canal stenosis and narrowing in both
subarticular recesses. Left worse than right foraminal narrowing. No
change.

L4-5: Status post discectomy and fusion.  No stenosis.

L5-S1: Status post discectomy and fusion. Residual endplate spur
noted. No stenosis.
IMPRESSION: No new abnormality since the prior MRI.

Spondylosis most notable at L3-4 where there is moderate central
canal stenosis, narrowing in both subarticular recesses and left
worse than right foraminal narrowing.

Status post L4-S1 discectomy and fusion. The central canal and
foramina are widely patent.

## 2020-11-24 ENCOUNTER — Other Ambulatory Visit: Payer: Self-pay

## 2020-11-24 ENCOUNTER — Encounter (HOSPITAL_COMMUNITY): Payer: Self-pay

## 2020-11-24 ENCOUNTER — Encounter (HOSPITAL_COMMUNITY)
Admission: RE | Admit: 2020-11-24 | Discharge: 2020-11-24 | Disposition: A | Discharge status: Home / Self Care | Payer: BC Managed Care – PPO | Source: Ambulatory Visit | Attending: Neurosurgery | Admitting: Neurosurgery

## 2020-11-24 DIAGNOSIS — Z01812 Encounter for preprocedural laboratory examination: Secondary | ICD-10-CM | POA: Insufficient documentation

## 2020-11-24 DIAGNOSIS — Z20822 Contact with and (suspected) exposure to covid-19: Secondary | ICD-10-CM | POA: Insufficient documentation

## 2020-11-24 HISTORY — DX: Personal history of urinary calculi: Z87.442

## 2020-11-24 LAB — SURGICAL PCR SCREEN
MRSA, PCR: NEGATIVE
Staphylococcus aureus: NEGATIVE

## 2020-11-24 LAB — CBC
HCT: 44.8 % (ref 39.0–52.0)
Hemoglobin: 15 g/dL (ref 13.0–17.0)
MCH: 31.1 pg (ref 26.0–34.0)
MCHC: 33.5 g/dL (ref 30.0–36.0)
MCV: 92.9 fL (ref 80.0–100.0)
Platelets: 247 10*3/uL (ref 150–400)
RBC: 4.82 MIL/uL (ref 4.22–5.81)
RDW: 12.5 % (ref 11.5–15.5)
WBC: 11.1 10*3/uL — ABNORMAL HIGH (ref 4.0–10.5)
nRBC: 0 % (ref 0.0–0.2)

## 2020-11-24 LAB — TYPE AND SCREEN
ABO/RH(D): A NEG
Antibody Screen: NEGATIVE

## 2020-11-24 LAB — SARS CORONAVIRUS 2 (TAT 6-24 HRS): SARS Coronavirus 2: NEGATIVE

## 2020-11-24 NOTE — Progress Notes (Signed)
PCP - Dr. Adria Dill ([facility])   ECHO - 2019 requested from [facility]   Sleep Study - 6-7 years ago CPAP - yes (will bring it with hime  ERAS Protcol - PRE-SURGERY Ensure or G2-   COVID TEST- done today  Anesthesia review: No  Patient denies shortness of breath, fever, cough and chest pain at PAT appointment   All instructions explained to the patient, with a verbal understanding of the material. Patient agrees to go over the instructions while at home for a better understanding. Patient also instructed to self quarantine after being tested for COVID-19. The opportunity to ask questions was provided.

## 2020-11-24 NOTE — Pre-Procedure Instructions (Signed)
Brian Franco  11/24/2020   Your procedure is scheduled on Thursday, November 27, 2020 at 7:30 AM.   Report to [facility] Entrance "A" Admitting Office at 5:30 AM.   Call this number if you have problems the morning of surgery: [phone]   Questions prior to day of surgery, please call [phone] between 8 & 4 PM.    Remember:  Do not eat or drink after midnight Wednesday, 11/26/20.  Take these medicines the morning of surgery with A SIP OF WATER: Citalopram (Celexa), Tamsulosin (Flomax), Oxycodone - if needed.  Stop NSAIDS (Ibuprofen, Aleve, etc) and Herbal medications as of today prior to surgery. Do not use Aspirin containing products, Vitamins or Fish Oil prior to surgery.    Do not wear jewelry.  Do not wear lotions, powders, cologne or deodorant.  Men may shave face and neck.  Do not bring valuables to the hospital.  [facility] is not responsible for any belongings or valuables.  Contacts, dentures or bridgework may not be worn into surgery.  Leave your suitcase in the car.  After surgery it may be brought to your room.  For patients admitted to the hospital, discharge time will be determined by your treatment team.  [facility] - Preparing for Surgery  Before surgery, you can play an important role.  Because skin is not sterile, your skin needs to be as free of germs as possible.  You can reduce the number of germs on you skin by washing with CHG (chlorahexidine gluconate) soap before surgery.  CHG is an antiseptic cleaner which kills germs and bonds with the skin to continue killing germs even after washing.  Oral Hygiene is also important in reducing the risk of infection.  Remember to brush your teeth with your regular toothpaste the morning of surgery.  Please DO NOT use if you have an allergy to CHG or antibacterial soaps.  If your skin becomes reddened/irritated stop using the CHG and inform your nurse when you arrive at Short Stay.  Do not shave (including  legs and underarms) for at least 48 hours prior to the first CHG shower.  You may shave your face.  Please follow these instructions carefully:   1.  Shower with CHG Soap the night before surgery and the morning of Surgery.  2.  If you choose to wash your hair, wash your hair first as usual with your normal shampoo.  3.  After you shampoo, rinse your hair and body thoroughly to remove the shampoo. 4.  Use CHG as you would any other liquid soap.  You can apply chg directly to the skin and wash gently with a      scrungie or washcloth.           5.  Apply the CHG Soap to your body ONLY FROM THE NECK DOWN.   Do not use on open wounds or open sores. Avoid contact with your eyes, ears, mouth and genitals (private parts).  Wash genitals (private parts) with your normal soap.  6.  Wash thoroughly, paying special attention to the area where your surgery will be performed.  7.  Thoroughly rinse your body with warm water from the neck down.  8.  DO NOT shower/wash with your normal soap after using and rinsing off the CHG Soap.  9.  Pat yourself dry with a clean towel.            10.  Wear clean pajamas.  11.  Place clean sheets on your bed the night of your first shower and do not sleep with pets.  Day of Surgery  Shower as above. Do not apply any lotions/deodorants the morning of surgery.   Please wear clean clothes to the hospital. Remember to brush your teeth with toothpaste.   Please read over the fact sheets that you were given.

## 2020-11-27 ENCOUNTER — Inpatient Hospital Stay (HOSPITAL_COMMUNITY): Payer: BC Managed Care – PPO

## 2020-11-27 ENCOUNTER — Inpatient Hospital Stay (HOSPITAL_COMMUNITY): Payer: BC Managed Care – PPO | Admitting: Anesthesiology

## 2020-11-27 ENCOUNTER — Encounter (HOSPITAL_COMMUNITY): Payer: Self-pay | Admitting: Neurosurgery

## 2020-11-27 ENCOUNTER — Encounter (HOSPITAL_COMMUNITY)
Admission: RE | Disposition: A | Discharge status: Home / Self Care | Payer: Self-pay | Source: Home / Self Care | Attending: Neurosurgery

## 2020-11-27 ENCOUNTER — Other Ambulatory Visit: Payer: Self-pay

## 2020-11-27 ENCOUNTER — Inpatient Hospital Stay (HOSPITAL_COMMUNITY)
Admission: RE | Admit: 2020-11-27 | Discharge: 2020-11-29 | DRG: 455 | Disposition: A | Discharge status: Home / Self Care | Payer: BC Managed Care – PPO | Attending: Neurosurgery | Admitting: Neurosurgery

## 2020-11-27 DIAGNOSIS — Z87442 Personal history of urinary calculi: Secondary | ICD-10-CM | POA: Diagnosis not present

## 2020-11-27 DIAGNOSIS — G4730 Sleep apnea, unspecified: Secondary | ICD-10-CM | POA: Diagnosis present

## 2020-11-27 DIAGNOSIS — M62830 Muscle spasm of back: Secondary | ICD-10-CM | POA: Diagnosis present

## 2020-11-27 DIAGNOSIS — Z20822 Contact with and (suspected) exposure to covid-19: Secondary | ICD-10-CM | POA: Diagnosis present

## 2020-11-27 DIAGNOSIS — Z79899 Other long term (current) drug therapy: Secondary | ICD-10-CM

## 2020-11-27 DIAGNOSIS — G5603 Carpal tunnel syndrome, bilateral upper limbs: Secondary | ICD-10-CM | POA: Diagnosis present

## 2020-11-27 DIAGNOSIS — M48062 Spinal stenosis, lumbar region with neurogenic claudication: Secondary | ICD-10-CM | POA: Diagnosis present

## 2020-11-27 DIAGNOSIS — Z981 Arthrodesis status: Secondary | ICD-10-CM | POA: Diagnosis not present

## 2020-11-27 DIAGNOSIS — I10 Essential (primary) hypertension: Secondary | ICD-10-CM | POA: Diagnosis present

## 2020-11-27 DIAGNOSIS — M419 Scoliosis, unspecified: Secondary | ICD-10-CM | POA: Diagnosis present

## 2020-11-27 DIAGNOSIS — M5116 Intervertebral disc disorders with radiculopathy, lumbar region: Secondary | ICD-10-CM | POA: Diagnosis present

## 2020-11-27 DIAGNOSIS — M4726 Other spondylosis with radiculopathy, lumbar region: Secondary | ICD-10-CM | POA: Diagnosis present

## 2020-11-27 DIAGNOSIS — Z419 Encounter for procedure for purposes other than remedying health state, unspecified: Secondary | ICD-10-CM

## 2020-11-27 DIAGNOSIS — Z87891 Personal history of nicotine dependence: Secondary | ICD-10-CM | POA: Diagnosis not present

## 2020-11-27 DIAGNOSIS — N400 Enlarged prostate without lower urinary tract symptoms: Secondary | ICD-10-CM | POA: Diagnosis present

## 2020-11-27 DIAGNOSIS — M5126 Other intervertebral disc displacement, lumbar region: Secondary | ICD-10-CM | POA: Diagnosis present

## 2020-11-27 DIAGNOSIS — R0600 Dyspnea, unspecified: Secondary | ICD-10-CM

## 2020-11-27 HISTORY — PX: TRANSFORAMINAL LUMBAR INTERBODY FUSION (TLIF) WITH PEDICLE SCREW FIXATION 1 LEVEL: SHX6141

## 2020-11-27 LAB — TROPONIN I (HIGH SENSITIVITY)
Troponin I (High Sensitivity): 11 ng/L (ref <18)
Troponin I (High Sensitivity): 12 ng/L (ref <18)

## 2020-11-27 LAB — GLUCOSE, CAPILLARY: Glucose-Capillary: 169 mg/dL — ABNORMAL HIGH (ref 70–99)

## 2020-11-27 LAB — BRAIN NATRIURETIC PEPTIDE: B Natriuretic Peptide: 62.4 pg/mL (ref 0.0–100.0)

## 2020-11-27 SURGERY — TRANSFORAMINAL LUMBAR INTERBODY FUSION (TLIF) WITH PEDICLE SCREW FIXATION 1 LEVEL
Anesthesia: General | Site: Back | Laterality: Left

## 2020-11-27 MED ORDER — CHLORHEXIDINE GLUCONATE CLOTH 2 % EX PADS: 6.0000 | MEDICATED_PAD | Freq: Once | CUTANEOUS | Status: DC

## 2020-11-27 MED ORDER — CLEVIDIPINE BUTYRATE 0.5 MG/ML IV EMUL
INTRAVENOUS | Status: AC
  Filled 2020-11-27: qty 50

## 2020-11-27 MED ORDER — ROCURONIUM BROMIDE 10 MG/ML (PF) SYRINGE
PREFILLED_SYRINGE | INTRAVENOUS | Status: DC | PRN
  Administered 2020-11-27: 30 mg via INTRAVENOUS
  Administered 2020-11-27: 20 mg via INTRAVENOUS
  Administered 2020-11-27: 40 mg via INTRAVENOUS
  Administered 2020-11-27: 80 mg via INTRAVENOUS

## 2020-11-27 MED ORDER — HYDROCODONE-ACETAMINOPHEN 5-325 MG PO TABS
2.0000 | ORAL_TABLET | ORAL | Status: DC | PRN
  Administered 2020-11-27 – 2020-11-29 (×8): 2 via ORAL
  Filled 2020-11-27 (×8): qty 2

## 2020-11-27 MED ORDER — THROMBIN 5000 UNITS EX SOLR
OROMUCOSAL | Status: DC | PRN
  Administered 2020-11-27: 5 mL via TOPICAL

## 2020-11-27 MED ORDER — CLEVIDIPINE BUTYRATE 0.5 MG/ML IV EMUL
0.0000 mg/h | INTRAVENOUS | Status: DC
  Filled 2020-11-27 (×2): qty 50

## 2020-11-27 MED ORDER — GLYCOPYRROLATE PF 0.2 MG/ML IJ SOSY
PREFILLED_SYRINGE | INTRAMUSCULAR | Status: AC
  Filled 2020-11-27: qty 1

## 2020-11-27 MED ORDER — ALBUMIN HUMAN 5 % IV SOLN: INTRAVENOUS | Status: DC | PRN

## 2020-11-27 MED ORDER — ONDANSETRON HCL 4 MG/2ML IJ SOLN
INTRAMUSCULAR | Status: DC | PRN
  Administered 2020-11-27: 4 mg via INTRAVENOUS

## 2020-11-27 MED ORDER — FENTANYL CITRATE (PF) 250 MCG/5ML IJ SOLN
INTRAMUSCULAR | Status: AC
  Filled 2020-11-27: qty 5

## 2020-11-27 MED ORDER — DEXAMETHASONE SODIUM PHOSPHATE 10 MG/ML IJ SOLN
INTRAMUSCULAR | Status: DC | PRN
  Administered 2020-11-27: 10 mg via INTRAVENOUS

## 2020-11-27 MED ORDER — CEFAZOLIN SODIUM-DEXTROSE 2-4 GM/100ML-% IV SOLN
2.0000 g | Freq: Three times a day (TID) | INTRAVENOUS | Status: AC
  Administered 2020-11-27 – 2020-11-28 (×2): 2 g via INTRAVENOUS
  Filled 2020-11-27 (×2): qty 100

## 2020-11-27 MED ORDER — ZOLPIDEM TARTRATE 5 MG PO TABS: 5.0000 mg | ORAL_TABLET | Freq: Every evening | ORAL | Status: DC | PRN

## 2020-11-27 MED ORDER — PROPOFOL 10 MG/ML IV BOLUS
INTRAVENOUS | Status: AC
  Filled 2020-11-27: qty 20

## 2020-11-27 MED ORDER — ONDANSETRON HCL 4 MG/2ML IJ SOLN
4.0000 mg | Freq: Four times a day (QID) | INTRAMUSCULAR | Status: DC | PRN
  Administered 2020-11-28: 4 mg via INTRAVENOUS
  Filled 2020-11-27: qty 2

## 2020-11-27 MED ORDER — SUGAMMADEX SODIUM 200 MG/2ML IV SOLN
INTRAVENOUS | Status: DC | PRN
  Administered 2020-11-27: 500 mg via INTRAVENOUS

## 2020-11-27 MED ORDER — DEXAMETHASONE SODIUM PHOSPHATE 10 MG/ML IJ SOLN
INTRAMUSCULAR | Status: AC
  Filled 2020-11-27: qty 1

## 2020-11-27 MED ORDER — EPHEDRINE 5 MG/ML INJ
INTRAVENOUS | Status: AC
  Filled 2020-11-27: qty 10

## 2020-11-27 MED ORDER — METHOCARBAMOL 1000 MG/10ML IJ SOLN
500.0000 mg | Freq: Four times a day (QID) | INTRAVENOUS | Status: DC | PRN
  Administered 2020-11-28: 500 mg via INTRAVENOUS
  Filled 2020-11-27: qty 500
  Filled 2020-11-27: qty 5

## 2020-11-27 MED ORDER — MENTHOL 3 MG MT LOZG: 1.0000 | LOZENGE | OROMUCOSAL | Status: DC | PRN

## 2020-11-27 MED ORDER — OXYCODONE HCL 5 MG PO TABS
5.0000 mg | ORAL_TABLET | Freq: Once | ORAL | Status: DC | PRN
Start: 2020-11-27 — End: 2020-11-27

## 2020-11-27 MED ORDER — HYDRALAZINE HCL 20 MG/ML IJ SOLN
10.0000 mg | Freq: Once | INTRAMUSCULAR | Status: AC
  Administered 2020-11-27: 10 mg via INTRAVENOUS

## 2020-11-27 MED ORDER — POLYETHYLENE GLYCOL 3350 17 G PO PACK: 17.0000 g | PACK | Freq: Every day | ORAL | Status: DC | PRN

## 2020-11-27 MED ORDER — LABETALOL HCL 5 MG/ML IV SOLN
10.0000 mg | INTRAVENOUS | Status: AC | PRN
  Administered 2020-11-27 (×3): 10 mg via INTRAVENOUS

## 2020-11-27 MED ORDER — ORAL CARE MOUTH RINSE: 15.0000 mL | Freq: Once | OROMUCOSAL | Status: AC

## 2020-11-27 MED ORDER — CEFAZOLIN SODIUM-DEXTROSE 2-4 GM/100ML-% IV SOLN
2.0000 g | INTRAVENOUS | Status: AC
  Administered 2020-11-27: 2 g via INTRAVENOUS
  Filled 2020-11-27: qty 100

## 2020-11-27 MED ORDER — BUPIVACAINE HCL (PF) 0.5 % IJ SOLN
INTRAMUSCULAR | Status: DC | PRN
  Administered 2020-11-27: 10 mL

## 2020-11-27 MED ORDER — HYDRALAZINE HCL 20 MG/ML IJ SOLN
INTRAMUSCULAR | Status: AC
  Filled 2020-11-27: qty 1

## 2020-11-27 MED ORDER — TAMSULOSIN HCL 0.4 MG PO CAPS
0.4000 mg | ORAL_CAPSULE | Freq: Every day | ORAL | Status: DC
  Administered 2020-11-27 – 2020-11-29 (×3): 0.4 mg via ORAL
  Filled 2020-11-27 (×3): qty 1

## 2020-11-27 MED ORDER — CHLORHEXIDINE GLUCONATE CLOTH 2 % EX PADS: 6.0000 | MEDICATED_PAD | Freq: Every day | CUTANEOUS | Status: DC

## 2020-11-27 MED ORDER — ALUM & MAG HYDROXIDE-SIMETH 200-200-20 MG/5ML PO SUSP: 30.0000 mL | Freq: Four times a day (QID) | ORAL | Status: DC | PRN

## 2020-11-27 MED ORDER — HYDROMORPHONE HCL 1 MG/ML IJ SOLN
0.2500 mg | INTRAMUSCULAR | Status: DC | PRN
  Administered 2020-11-27 (×3): 0.5 mg via INTRAVENOUS

## 2020-11-27 MED ORDER — CHLORHEXIDINE GLUCONATE 0.12 % MT SOLN
15.0000 mL | Freq: Once | OROMUCOSAL | Status: AC
  Administered 2020-11-27: 15 mL via OROMUCOSAL
  Filled 2020-11-27: qty 15

## 2020-11-27 MED ORDER — ONDANSETRON HCL 4 MG PO TABS: 4.0000 mg | ORAL_TABLET | Freq: Four times a day (QID) | ORAL | Status: DC | PRN

## 2020-11-27 MED ORDER — CLEVIDIPINE BUTYRATE 0.5 MG/ML IV EMUL
0.0000 mg/h | INTRAVENOUS | Status: DC
  Administered 2020-11-27: 1 mg/h via INTRAVENOUS

## 2020-11-27 MED ORDER — PHENOL 1.4 % MT LIQD: 1.0000 | OROMUCOSAL | Status: DC | PRN

## 2020-11-27 MED ORDER — MIDAZOLAM HCL 2 MG/2ML IJ SOLN
INTRAMUSCULAR | Status: DC | PRN
  Administered 2020-11-27: 2 mg via INTRAVENOUS

## 2020-11-27 MED ORDER — OXYCODONE-ACETAMINOPHEN 7.5-325 MG PO TABS: 1.0000 | ORAL_TABLET | Freq: Four times a day (QID) | ORAL | Status: DC | PRN

## 2020-11-27 MED ORDER — FENTANYL CITRATE (PF) 250 MCG/5ML IJ SOLN
INTRAMUSCULAR | Status: DC | PRN
  Administered 2020-11-27: 150 ug via INTRAVENOUS
  Administered 2020-11-27 (×2): 100 ug via INTRAVENOUS
  Administered 2020-11-27: 150 ug via INTRAVENOUS

## 2020-11-27 MED ORDER — CHLORHEXIDINE GLUCONATE CLOTH 2 % EX PADS
6.0000 | MEDICATED_PAD | Freq: Once | CUTANEOUS | Status: DC
Start: 2020-11-27 — End: 2020-11-27
  Administered 2020-11-27: 6 via TOPICAL

## 2020-11-27 MED ORDER — BUPIVACAINE HCL (PF) 0.5 % IJ SOLN
INTRAMUSCULAR | Status: AC
  Filled 2020-11-27: qty 30

## 2020-11-27 MED ORDER — PROPOFOL 10 MG/ML IV BOLUS
INTRAVENOUS | Status: DC | PRN
  Administered 2020-11-27: 150 mg via INTRAVENOUS

## 2020-11-27 MED ORDER — LIDOCAINE-EPINEPHRINE 1 %-1:100000 IJ SOLN
INTRAMUSCULAR | Status: AC
  Filled 2020-11-27: qty 1

## 2020-11-27 MED ORDER — ORAL CARE MOUTH RINSE
15.0000 mL | Freq: Two times a day (BID) | OROMUCOSAL | Status: DC
  Administered 2020-11-28: 15 mL via OROMUCOSAL

## 2020-11-27 MED ORDER — FENOFIBRATE 160 MG PO TABS
160.0000 mg | ORAL_TABLET | Freq: Every day | ORAL | Status: DC
  Administered 2020-11-27 – 2020-11-29 (×3): 160 mg via ORAL
  Filled 2020-11-27 (×3): qty 1

## 2020-11-27 MED ORDER — KCL IN DEXTROSE-NACL 20-5-0.45 MEQ/L-%-% IV SOLN
INTRAVENOUS | Status: DC
  Filled 2020-11-27 (×2): qty 1000

## 2020-11-27 MED ORDER — DOCUSATE SODIUM 100 MG PO CAPS
100.0000 mg | ORAL_CAPSULE | Freq: Two times a day (BID) | ORAL | Status: DC
  Administered 2020-11-27 – 2020-11-29 (×4): 100 mg via ORAL
  Filled 2020-11-27 (×4): qty 1

## 2020-11-27 MED ORDER — BISACODYL 10 MG RE SUPP: 10.0000 mg | Freq: Every day | RECTAL | Status: DC | PRN

## 2020-11-27 MED ORDER — SUGAMMADEX SODIUM 500 MG/5ML IV SOLN
INTRAVENOUS | Status: AC
  Filled 2020-11-27: qty 5

## 2020-11-27 MED ORDER — THROMBIN 5000 UNITS EX SOLR
CUTANEOUS | Status: AC
  Filled 2020-11-27: qty 5000

## 2020-11-27 MED ORDER — ACETAMINOPHEN 650 MG RE SUPP: 650.0000 mg | RECTAL | Status: DC | PRN

## 2020-11-27 MED ORDER — LABETALOL HCL 5 MG/ML IV SOLN
INTRAVENOUS | Status: AC
  Filled 2020-11-27: qty 4

## 2020-11-27 MED ORDER — BUPIVACAINE LIPOSOME 1.3 % IJ SUSP
INTRAMUSCULAR | Status: DC | PRN
  Administered 2020-11-27: 20 mL

## 2020-11-27 MED ORDER — SODIUM CHLORIDE 0.9 % IV SOLN: 250.0000 mL | INTRAVENOUS | Status: DC

## 2020-11-27 MED ORDER — HYDROMORPHONE HCL 1 MG/ML IJ SOLN
0.5000 mg | INTRAMUSCULAR | Status: DC | PRN
  Administered 2020-11-27 – 2020-11-28 (×4): 0.5 mg via INTRAVENOUS
  Filled 2020-11-27 (×4): qty 0.5

## 2020-11-27 MED ORDER — HYDROMORPHONE HCL 1 MG/ML IJ SOLN
INTRAMUSCULAR | Status: AC
  Filled 2020-11-27: qty 1

## 2020-11-27 MED ORDER — 0.9 % SODIUM CHLORIDE (POUR BTL) OPTIME
TOPICAL | Status: DC | PRN
  Administered 2020-11-27: 1000 mL

## 2020-11-27 MED ORDER — PANTOPRAZOLE SODIUM 40 MG IV SOLR
40.0000 mg | Freq: Every day | INTRAVENOUS | Status: DC
  Administered 2020-11-27 – 2020-11-28 (×2): 40 mg via INTRAVENOUS
  Filled 2020-11-27 (×2): qty 40

## 2020-11-27 MED ORDER — ACETAMINOPHEN 325 MG PO TABS
650.0000 mg | ORAL_TABLET | ORAL | Status: DC | PRN
  Administered 2020-11-29: 650 mg via ORAL
  Filled 2020-11-27 (×2): qty 2

## 2020-11-27 MED ORDER — METHOCARBAMOL 500 MG PO TABS
500.0000 mg | ORAL_TABLET | Freq: Four times a day (QID) | ORAL | Status: DC | PRN
  Administered 2020-11-28 – 2020-11-29 (×5): 500 mg via ORAL
  Filled 2020-11-27 (×5): qty 1

## 2020-11-27 MED ORDER — OXYCODONE HCL 5 MG PO TABS
5.0000 mg | ORAL_TABLET | ORAL | Status: DC | PRN
  Administered 2020-11-28 – 2020-11-29 (×6): 5 mg via ORAL
  Filled 2020-11-27 (×6): qty 1

## 2020-11-27 MED ORDER — MIDAZOLAM HCL 2 MG/2ML IJ SOLN
INTRAMUSCULAR | Status: AC
  Filled 2020-11-27: qty 2

## 2020-11-27 MED ORDER — EPHEDRINE SULFATE-NACL 50-0.9 MG/10ML-% IV SOSY
PREFILLED_SYRINGE | INTRAVENOUS | Status: DC | PRN
Start: 2020-11-27 — End: 2020-11-27
  Administered 2020-11-27 (×3): 10 mg via INTRAVENOUS
  Administered 2020-11-27: 5 mg via INTRAVENOUS

## 2020-11-27 MED ORDER — LIDOCAINE 2% (20 MG/ML) 5 ML SYRINGE
INTRAMUSCULAR | Status: DC | PRN
  Administered 2020-11-27: 40 mg via INTRAVENOUS
  Administered 2020-11-27: 60 mg via INTRAVENOUS

## 2020-11-27 MED ORDER — SODIUM CHLORIDE 0.9% FLUSH
3.0000 mL | Freq: Two times a day (BID) | INTRAVENOUS | Status: DC
  Administered 2020-11-28 – 2020-11-29 (×3): 3 mL via INTRAVENOUS

## 2020-11-27 MED ORDER — BUPIVACAINE LIPOSOME 1.3 % IJ SUSP
INTRAMUSCULAR | Status: AC
  Filled 2020-11-27: qty 20

## 2020-11-27 MED ORDER — LIDOCAINE 2% (20 MG/ML) 5 ML SYRINGE
INTRAMUSCULAR | Status: AC
  Filled 2020-11-27: qty 5

## 2020-11-27 MED ORDER — BARBERRY-OREG GRAPE-GOLDENSEAL 200-200-50 MG PO CAPS: ORAL_CAPSULE | Freq: Every day | ORAL | Status: DC

## 2020-11-27 MED ORDER — ONDANSETRON HCL 4 MG/2ML IJ SOLN
INTRAMUSCULAR | Status: AC
  Filled 2020-11-27: qty 2

## 2020-11-27 MED ORDER — CLEVIDIPINE BUTYRATE 0.5 MG/ML IV EMUL
INTRAVENOUS | Status: AC
  Administered 2020-11-27: 10 mg/h via INTRAVENOUS
  Filled 2020-11-27: qty 50

## 2020-11-27 MED ORDER — OXYCODONE HCL 5 MG/5ML PO SOLN: 5.0000 mg | Freq: Once | ORAL | Status: DC | PRN

## 2020-11-27 MED ORDER — CITALOPRAM HYDROBROMIDE 40 MG PO TABS
40.0000 mg | ORAL_TABLET | Freq: Every day | ORAL | Status: DC
  Administered 2020-11-27 – 2020-11-29 (×3): 40 mg via ORAL
  Filled 2020-11-27 (×3): qty 1

## 2020-11-27 MED ORDER — LACTATED RINGERS IV SOLN: INTRAVENOUS | Status: DC

## 2020-11-27 MED ORDER — ONDANSETRON HCL 4 MG/2ML IJ SOLN: 4.0000 mg | Freq: Four times a day (QID) | INTRAMUSCULAR | Status: DC | PRN

## 2020-11-27 MED ORDER — SODIUM CHLORIDE 0.9% FLUSH: 3.0000 mL | INTRAVENOUS | Status: DC | PRN

## 2020-11-27 MED ORDER — LIDOCAINE-EPINEPHRINE 1 %-1:100000 IJ SOLN
INTRAMUSCULAR | Status: DC | PRN
  Administered 2020-11-27: 10 mL

## 2020-11-27 MED ORDER — FLEET ENEMA 7-19 GM/118ML RE ENEM: 1.0000 | ENEMA | Freq: Once | RECTAL | Status: DC | PRN

## 2020-11-27 MED ORDER — ROCURONIUM BROMIDE 10 MG/ML (PF) SYRINGE
PREFILLED_SYRINGE | INTRAVENOUS | Status: AC
  Filled 2020-11-27: qty 20

## 2020-11-27 SURGICAL SUPPLY — 74 items
ADH SKN CLS APL DERMABOND .7 (GAUZE/BANDAGES/DRESSINGS) ×1
BAG COUNTER SPONGE SURGICOUNT (BAG) ×4 IMPLANT
BAG SPNG CNTER NS LX DISP (BAG) ×3
BASKET BONE COLLECTION (BASKET) ×2 IMPLANT
BLADE CLIPPER SURG (BLADE) IMPLANT
BONE CANC CHIPS 20CC PCAN1/4 (Bone Implant) ×2 IMPLANT
BUR MATCHSTICK NEURO 3.0 LAGG (BURR) ×2 IMPLANT
BUR PRECISION FLUTE 5.0 (BURR) ×2 IMPLANT
CANISTER SUCT 3000ML PPV (MISCELLANEOUS) ×2 IMPLANT
CARTRIDGE OIL MAESTRO DRILL (MISCELLANEOUS) ×1 IMPLANT
CHIPS CANC BONE 20CC PCAN1/4 (Bone Implant) ×1 IMPLANT
CNTNR URN SCR LID CUP LEK RST (MISCELLANEOUS) ×1 IMPLANT
CONT SPEC 4OZ STRL OR WHT (MISCELLANEOUS) ×2
COVER BACK TABLE 24X17X13 BIG (DRAPES) ×2 IMPLANT
COVER BACK TABLE 60X90IN (DRAPES) ×2 IMPLANT
DECANTER SPIKE VIAL GLASS SM (MISCELLANEOUS) ×2 IMPLANT
DERMABOND ADVANCED (GAUZE/BANDAGES/DRESSINGS) ×1
DERMABOND ADVANCED .7 DNX12 (GAUZE/BANDAGES/DRESSINGS) ×1 IMPLANT
DIFFUSER DRILL AIR PNEUMATIC (MISCELLANEOUS) ×2 IMPLANT
DRAPE C-ARM 42X72 X-RAY (DRAPES) ×2 IMPLANT
DRAPE C-ARMOR (DRAPES) ×2 IMPLANT
DRAPE LAPAROTOMY 100X72X124 (DRAPES) ×2 IMPLANT
DRAPE SURG 17X23 STRL (DRAPES) ×2 IMPLANT
DRSG OPSITE POSTOP 4X8 (GAUZE/BANDAGES/DRESSINGS) ×1 IMPLANT
DURAPREP 26ML APPLICATOR (WOUND CARE) ×2 IMPLANT
ELECT REM PT RETURN 9FT ADLT (ELECTROSURGICAL) ×2
ELECTRODE REM PT RTRN 9FT ADLT (ELECTROSURGICAL) ×1 IMPLANT
GAUZE 4X4 16PLY ~~LOC~~+RFID DBL (SPONGE) ×1 IMPLANT
GAUZE SPONGE 4X4 12PLY STRL (GAUZE/BANDAGES/DRESSINGS) ×1 IMPLANT
GLOVE EXAM NITRILE XL STR (GLOVE) IMPLANT
GLOVE SRG 8 PF TXTR STRL LF DI (GLOVE) ×2 IMPLANT
GLOVE SURG ENC MOIS LTX SZ8 (GLOVE) ×4 IMPLANT
GLOVE SURG LTX SZ8 (GLOVE) ×4 IMPLANT
GLOVE SURG UNDER POLY LF SZ8 (GLOVE) ×4
GLOVE SURG UNDER POLY LF SZ8.5 (GLOVE) ×4 IMPLANT
GOWN STRL REUS W/ TWL LRG LVL3 (GOWN DISPOSABLE) IMPLANT
GOWN STRL REUS W/ TWL XL LVL3 (GOWN DISPOSABLE) ×2 IMPLANT
GOWN STRL REUS W/TWL 2XL LVL3 (GOWN DISPOSABLE) ×4 IMPLANT
GOWN STRL REUS W/TWL LRG LVL3 (GOWN DISPOSABLE)
GOWN STRL REUS W/TWL XL LVL3 (GOWN DISPOSABLE) ×4
HEMOSTAT POWDER KIT SURGIFOAM (HEMOSTASIS) ×2 IMPLANT
IMPL TLX20 9X11X31 20D (Cage) IMPLANT
KIT BASIN OR (CUSTOM PROCEDURE TRAY) ×2 IMPLANT
KIT INFUSE X SMALL 1.4CC (Orthopedic Implant) ×1 IMPLANT
KIT POSITION SURG JACKSON T1 (MISCELLANEOUS) ×2 IMPLANT
KIT TURNOVER KIT B (KITS) ×2 IMPLANT
NDL HYPO 25X1 1.5 SAFETY (NEEDLE) ×1 IMPLANT
NEEDLE HYPO 25X1 1.5 SAFETY (NEEDLE) ×2 IMPLANT
NEEDLE SPNL 18GX3.5 QUINCKE PK (NEEDLE) ×2 IMPLANT
NS IRRIG 1000ML POUR BTL (IV SOLUTION) ×2 IMPLANT
OIL CARTRIDGE MAESTRO DRILL (MISCELLANEOUS) ×2
PACK LAMINECTOMY NEURO (CUSTOM PROCEDURE TRAY) ×2 IMPLANT
PAD ARMBOARD 7.5X6 YLW CONV (MISCELLANEOUS) ×6 IMPLANT
PATTIES SURGICAL .5 X.5 (GAUZE/BANDAGES/DRESSINGS) IMPLANT
PATTIES SURGICAL .5 X1 (DISPOSABLE) IMPLANT
PATTIES SURGICAL 1X1 (DISPOSABLE) IMPLANT
ROD RELINE TI LATERAL MED OFF (Rod) ×1 IMPLANT
SCREW LOCK RELINE 5.5 TULIP (Screw) ×4 IMPLANT
SCREW RELINE-O POLY 6.5X50MM (Screw) ×2 IMPLANT
SPONGE SURGIFOAM ABS GEL SZ50 (HEMOSTASIS) IMPLANT
SPONGE T-LAP 4X18 ~~LOC~~+RFID (SPONGE) ×3 IMPLANT
STAPLER SKIN PROX WIDE 3.9 (STAPLE) ×1 IMPLANT
SUT VIC AB 1 CT1 18XBRD ANBCTR (SUTURE) ×2 IMPLANT
SUT VIC AB 1 CT1 8-18 (SUTURE) ×4
SUT VIC AB 2-0 CT1 18 (SUTURE) ×4 IMPLANT
SUT VIC AB 3-0 SH 8-18 (SUTURE) ×6 IMPLANT
SYR 3ML LL SCALE MARK (SYRINGE) ×4 IMPLANT
SYR 5ML LL (SYRINGE) ×4 IMPLANT
TEMPLATE ROD SILICON 250 (ORTHOPEDIC DISPOSABLE SUPPLIES) ×1 IMPLANT
TLX20 IMPLANT 9X11X31 20D (Cage) ×2 IMPLANT
TOWEL GREEN STERILE (TOWEL DISPOSABLE) ×2 IMPLANT
TOWEL GREEN STERILE FF (TOWEL DISPOSABLE) ×2 IMPLANT
TRAY FOLEY MTR SLVR 16FR STAT (SET/KITS/TRAYS/PACK) ×2 IMPLANT
WATER STERILE IRR 1000ML POUR (IV SOLUTION) ×2 IMPLANT

## 2020-11-27 NOTE — Transfer of Care (Signed)
Immediate Anesthesia Transfer of Care Note  Patient: Brian Franco  Procedure(s) Performed: Left Lumbar Two-Three Transforaminal lumbar interbody fusion with exploration of adjacent level fixation (Left: Back)  Patient Location: PACU  Anesthesia Type:General  Level of Consciousness: drowsy and patient cooperative  Airway & Oxygen Therapy: Patient Spontanous Breathing and Patient connected to face mask oxygen  Post-op Assessment: Report given to RN and Post -op Vital signs reviewed and stable  Post vital signs: Reviewed and stable  Last Vitals:  Vitals Value Taken Time  BP 149/87 11/27/20 1143  Temp    Pulse 76 11/27/20 1144  Resp 20 11/27/20 1144  SpO2 98 % 11/27/20 1144  Vitals shown include unvalidated device data.  Last Pain:  Vitals:   11/27/20 0635  TempSrc:   PainSc: 8          Complications: No notable events documented.

## 2020-11-27 NOTE — Op Note (Signed)
11/27/2020  11:24 AM  PATIENT:  Brian Franco  65 y.o. male  PRE-OPERATIVE DIAGNOSIS:  Spinal stenosis, Lumbar region with neurogenic claudication, herniated lumbar disc with lumbago, radiculopathy L 23 level  POST-OPERATIVE DIAGNOSIS:  Spinal stenosis, Lumbar region with neurogenic claudication, herniated lumbar disc with lumbago, radiculopathy L 23 level  PROCEDURE:  Procedure(s) with comments: Left Lumbar Two-Three Transforaminal lumbar interbody fusion with exploration of adjacent level fixation (Left) - Left Lumbar Two-Three Transforaminal lumbar interbody fusion with exploration of adjacent level fixation   SURGEON:  Surgeon(s) and Role:    Maeola Harman, MD - Primary  PHYSICIAN ASSISTANT: Julien Girt, NP  ASSISTANTS: Poteat, RN   ANESTHESIA:   general  EBL:  200 mL   BLOOD ADMINISTERED:none  DRAINS: none   LOCAL MEDICATIONS USED:  MARCAINE    and LIDOCAINE   SPECIMEN:  No Specimen  DISPOSITION OF SPECIMEN:  N/A  COUNTS:  YES  TOURNIQUET:  * No tourniquets in log *  DICTATION: Patient is 65 year old man with lumbar scoliosis and previous fusion L 3 - S 1 levels with stenosis at  L 23 with disc herniation and severe spinal stenosis and a long history of severe back and bilateral leg pain.  It was elected to take him to surgery for exploration of previous fusion with decompression and fusion of L 23 levels.   Procedure: Patient was placed in a prone position on the Metter table after smooth and uncomplicated induction of general endotracheal anesthesia. His low back was marked with C arm and Pulse and prepped and draped in usual sterile fashion with betadine scrub and DuraPrep. Area of incision was infiltrated with local lidocaine. Incision was made to the lumbodorsal fascia was incised and exposure was performed of the L 23 spinous processes laminae facet joint and transverse processes. Previous hardware was exposed.  This was covered with dense bone growth and required  rongeurs to remove the bone to expose the previously placed hardware. The bilateral connectors were disconnected and there appeared to be solidly in bone and there was dense bridging bone across the L 3 4 level without loosening of hardware.  Intraoperative x-ray was obtained which confirmed correct orientation with marker probes at L2  level.   A total hemi-laminectomy of L 2 through L 3  levels was performed with disarticulation of the facet joints and thorough decompression was performed of both L 2 , L 3   nerve roots along with the common dural tube on the left. Decompression was greater than would be typical for PLIF. A thorough discectomy was performed with removal of multiple free fragments. A thorough discectomy was then performed on the left with preparation of the endplates for grafting a trial spacer was placed on this level .  After trial sizing and utilization of sequential shavers, a 9 x 11 x 28 mm x 20 degree titanium TLIF cage was packed with 6 cc of autograft and extra small BMP . There was good correction of scoliotic curvature. The posterolateral region was extensively decorticated and pedicle probes were placed at L 2 bilaterally. Intraoperative fluoroscopy confirmed correct orientationin the AP and lateral plane. 50 x 6.5 mm pedicle screws were placed at L 2 bilaterally. A z-rod was placed on the right and a lordotic rod was placed on the left connecting the connectors, L 3 and L 2 screws and were locked down in situ.  The posterolateral region was packed on the right, including the decorticated right L 23 facet with 30  cc bone graft on the and remaining BMP.   The wound was irrigated.  Fascia was closed with 1 Vicryl sutures skin edges were reapproximated 2 and 3-0 Vicryl sutures. The wound was dressed with Dermabond and  an occlusive dressing the patient was extubated in the operating room and taken to recovery in stable satisfactory condition.  He tolerated the operation well.  Counts were  correct at the end of the case.   PLAN OF CARE: Admit to inpatient   PATIENT DISPOSITION:  PACU - hemodynamically stable.   Delay start of Pharmacological VTE agent (>24hrs) due to surgical blood loss or risk of bleeding: yes  

## 2020-11-27 NOTE — Consult Note (Signed)
NAME:  Brian Franco, MRN:  [id number], DOB:  Aug 14, 1955, LOS: 0 ADMISSION DATE:  11/27/2020, CONSULTATION DATE:  7;21 REFERRING MD:  stern, CHIEF COMPLAINT:  HTN   History of Present Illness:  This is a 72 yom w/ h/o chronic back pain and spinal stenosis. Failed outpt treatment for pain and paresthesia so presented to [facility] 7/21 for planned L2,-L3 Lumbar fixation and fusion. Case uneventful. Returned to PACU w/ progressive hypertension w/ peak BP 229/108. Remained elevated in spite of hydralazine so placed on cleviprex gtt. PCCM asked to assist w/ BP management  Pertinent  Medical History  Spinal stenosis HTN recorded in office  Chronic back pain, s/p 2 lumbar steroid injections 2022, prior lumbar surgeries.  Tobacco abuse (stopped 2 mo prior to admit)  Significant Hospital Events: Including procedures, antibiotic start and stop dates in addition to other pertinent events   7/21 s/p l2-L3 lumbar surg. Post op Hypertension. Started on Cleviprex PCCM asked to see   Interim History / Subjective:  No shortness of breath, HA, chest pain. No palpitations. Has moderate surgical site pain.   Objective   Blood pressure (Abnormal) 168/83, pulse 79, temperature 97.6 F (36.4 C), resp. rate 20, height 6\' 4"  (1.93 m), weight 113.3 kg, SpO2 95 %.        Intake/Output Summary (Last 24 hours) at 11/27/2020 1611 Last data filed at 11/27/2020 1500 Gross per 24 hour  Intake 1500 ml  Output 1425 ml  Net 75 ml   Filed Weights   11/27/20 0604  Weight: 113.3 kg    Examination: General: well developed 64 yom no distress.  HENT: NCAT no JVD MMM Lungs: clear and w/out accessory use. Currently on 3 LPM Cardiovascular: RRR w/ no MRG  Abdomen: soft not tender  Extremities: warm and dry Neuro: awake and oriented  GU: foley in place   Resolved Hospital Problem list     Assessment & Plan:  Acute Post-operative Hypertension -not hypoxic, pain controlled. Appears stable and asymptomatic. Suspect  this is sympathetic response to surg. If so should be transient ~ 6 hrs or so.  Plan Cont cleviprex gtt. Goal SBP < 170 Will hold off on maintenance/oral dosing for now as if true APH should be short lasting but I did see not of HTN mentioned in passing on one of his notes from pain management.   Pain control  Pulse ox PCXR r/o edema BNP 12 lead CEs X 1 Hold off on ECHO  S/p lumbar surgery  Plan  Per neurosurg teal   H/o BPH Plan Flomax  H/o HL Plan Cont fenofiberate    Best Practice (right click and "Reselect all SmartList Selections" daily)   Diet/type: clear liquids DVT prophylaxis: SCD GI prophylaxis: N/A Lines: N/A Foley:  Yes, and it is still needed Code Status:  full code Last date of multidisciplinary goals of care discussion [pending ]  Labs   CBC: Recent Labs  Lab 11/24/20 0946  WBC 11.1*  HGB 15.0  HCT 44.8  MCV 92.9  PLT 247    Basic Metabolic Panel: No results for input(s): NA, K, CL, CO2, GLUCOSE, BUN, CREATININE, CALCIUM, MG, PHOS in the last 168 hours. GFR: CrCl cannot be calculated (Patient's most recent lab result is older than the maximum 21 days allowed.). Recent Labs  Lab 11/24/20 0946  WBC 11.1*    Liver Function Tests: No results for input(s): AST, ALT, ALKPHOS, BILITOT, PROT, ALBUMIN in the last 168 hours. No results for input(s): LIPASE, AMYLASE  in the last 168 hours. No results for input(s): AMMONIA in the last 168 hours.  ABG No results found for: PHART, PCO2ART, PO2ART, HCO3, TCO2, ACIDBASEDEF, O2SAT   Coagulation Profile: No results for input(s): INR, PROTIME in the last 168 hours.  Cardiac Enzymes: No results for input(s): CKTOTAL, CKMB, CKMBINDEX, TROPONINI in the last 168 hours.  HbA1C: No results found for: HGBA1C  CBG: No results for input(s): GLUCAP in the last 168 hours.  Review of Systems:   Review of Systems  Constitutional:  Positive for diaphoresis.  HENT: Negative.    Eyes: Negative.    Respiratory: Negative.    Cardiovascular: Negative.   Gastrointestinal: Negative.   Genitourinary: Negative.   Musculoskeletal:  Positive for back pain.  Skin: Negative.   Neurological: Negative.   Endo/Heme/Allergies: Negative.   Psychiatric/Behavioral: Negative.      Past Medical History:  He,  has a past medical history of History of kidney stones, Lumbar stenosis with neurogenic claudication, and Sleep apnea.   Surgical History:   Past Surgical History:  Procedure Laterality Date   ANTERIOR LAT LUMBAR FUSION Left 04/03/2019   Procedure: Left Lumbar Three-Four Anterolateral interbody fusion with lateral plate;  Surgeon: Maeola Harman, MD;  Location: [facility] OR;  Service: Neurosurgery;  Laterality: Left;  Left Lumbar Three-Four Anterolateral interbody fusion with lateral plate   APPENDECTOMY     BACK SURGERY     Laminectomy L5-S1 - x 3   CARPAL TUNNEL RELEASE Right 07/17/2019   Procedure: Right carpal tunnel release;  Surgeon: Maeola Harman, MD;  Location: [facility] OR;  Service: Neurosurgery;  Laterality: Right;   CARPAL TUNNEL RELEASE Left    COLONOSCOPY     KNEE ARTHROSCOPY     Right   WISDOM TOOTH EXTRACTION       Social History:   reports that he quit smoking about 2 months ago. His smoking use included cigarettes. He has a 64.50 pack-year smoking history. He has never used smokeless tobacco. He reports current alcohol use. He reports that he does not use drugs.   Family History:  His family history is not on file.   Allergies No Known Allergies   Home Medications  Prior to Admission medications   Medication Sig Start Date End Date Taking? Authorizing Provider  Barberry-Oreg Grape-Goldenseal (BERBERINE COMPLEX PO) Take 1 tablet by mouth daily.   Yes [provider]  citalopram (CELEXA) 40 MG tablet Take 40 mg by mouth daily. 09/01/20  Yes [provider]  fenofibrate 160 MG tablet Take 160 mg by mouth daily.   Yes [provider]  ibuprofen  (ADVIL) 800 MG tablet Take 800 mg by mouth every 6 (six) hours as needed for moderate pain.   Yes [provider]  oxyCODONE-acetaminophen (PERCOCET) 7.5-325 MG tablet Take 1 tablet by mouth 4 (four) times daily as needed (severe pain).    Yes [provider]  tamsulosin (FLOMAX) 0.4 MG CAPS capsule Take 0.4 mg by mouth daily.   Yes [provider]     Critical care time: 20 min      Simonne Martinet ACNP-BC [facility] Pulmonary/Critical Care Pager # [phone] OR # [phone] if no answer

## 2020-11-27 NOTE — Anesthesia Preprocedure Evaluation (Signed)
Anesthesia Evaluation  Patient identified by MRN, date of birth, ID band Patient awake    Reviewed: Allergy & Precautions, H&P , NPO status , Patient's Chart, lab work & pertinent test results  Airway Mallampati: II   Neck ROM: full    Dental   Pulmonary sleep apnea , former smoker,    breath sounds clear to auscultation       Cardiovascular negative cardio ROS   Rhythm:regular Rate:Normal     Neuro/Psych  Neuromuscular disease    GI/Hepatic   Endo/Other    Renal/GU      Musculoskeletal   Abdominal   Peds  Hematology   Anesthesia Other Findings   Reproductive/Obstetrics                             Anesthesia Physical Anesthesia Plan  ASA: 2  Anesthesia Plan: General   Post-op Pain Management:    Induction: Intravenous  PONV Risk Score and Plan: 2 and Ondansetron, Dexamethasone, Midazolam and Treatment may vary due to age or medical condition  Airway Management Planned: Oral ETT  Additional Equipment:   Intra-op Plan:   Post-operative Plan: Extubation in OR  Informed Consent: I have reviewed the patients History and Physical, chart, labs and discussed the procedure including the risks, benefits and alternatives for the proposed anesthesia with the patient or authorized representative who has indicated his/her understanding and acceptance.     Dental advisory given  Plan Discussed with: CRNA, Anesthesiologist and Surgeon  Anesthesia Plan Comments:         Anesthesia Quick Evaluation

## 2020-11-27 NOTE — Brief Op Note (Signed)
11/27/2020  11:24 AM  PATIENT:  Brian Franco  65 y.o. male  PRE-OPERATIVE DIAGNOSIS:  Spinal stenosis, Lumbar region with neurogenic claudication, herniated lumbar disc with lumbago, radiculopathy L 23 level  POST-OPERATIVE DIAGNOSIS:  Spinal stenosis, Lumbar region with neurogenic claudication, herniated lumbar disc with lumbago, radiculopathy L 23 level  PROCEDURE:  Procedure(s) with comments: Left Lumbar Two-Three Transforaminal lumbar interbody fusion with exploration of adjacent level fixation (Left) - Left Lumbar Two-Three Transforaminal lumbar interbody fusion with exploration of adjacent level fixation   SURGEON:  Surgeon(s) and Role:    Maeola Harman, MD - Primary  PHYSICIAN ASSISTANT: Julien Girt, NP  ASSISTANTS: Poteat, RN   ANESTHESIA:   general  EBL:  200 mL   BLOOD ADMINISTERED:none  DRAINS: none   LOCAL MEDICATIONS USED:  MARCAINE    and LIDOCAINE   SPECIMEN:  No Specimen  DISPOSITION OF SPECIMEN:  N/A  COUNTS:  YES  TOURNIQUET:  * No tourniquets in log *  DICTATION: Patient is 65 year old man with lumbar scoliosis and previous fusion L 3 - S 1 levels with stenosis at  L 23 with disc herniation and severe spinal stenosis and a long history of severe back and bilateral leg pain.  It was elected to take him to surgery for exploration of previous fusion with decompression and fusion of L 23 levels.   Procedure: Patient was placed in a prone position on the Metter table after smooth and uncomplicated induction of general endotracheal anesthesia. His low back was marked with C arm and Pulse and prepped and draped in usual sterile fashion with betadine scrub and DuraPrep. Area of incision was infiltrated with local lidocaine. Incision was made to the lumbodorsal fascia was incised and exposure was performed of the L 23 spinous processes laminae facet joint and transverse processes. Previous hardware was exposed.  This was covered with dense bone growth and required  rongeurs to remove the bone to expose the previously placed hardware. The bilateral connectors were disconnected and there appeared to be solidly in bone and there was dense bridging bone across the L 3 4 level without loosening of hardware.  Intraoperative x-ray was obtained which confirmed correct orientation with marker probes at L2  level.   A total hemi-laminectomy of L 2 through L 3  levels was performed with disarticulation of the facet joints and thorough decompression was performed of both L 2 , L 3   nerve roots along with the common dural tube on the left. Decompression was greater than would be typical for PLIF. A thorough discectomy was performed with removal of multiple free fragments. A thorough discectomy was then performed on the left with preparation of the endplates for grafting a trial spacer was placed on this level .  After trial sizing and utilization of sequential shavers, a 9 x 11 x 28 mm x 20 degree titanium TLIF cage was packed with 6 cc of autograft and extra small BMP . There was good correction of scoliotic curvature. The posterolateral region was extensively decorticated and pedicle probes were placed at L 2 bilaterally. Intraoperative fluoroscopy confirmed correct orientationin the AP and lateral plane. 50 x 6.5 mm pedicle screws were placed at L 2 bilaterally. A z-rod was placed on the right and a lordotic rod was placed on the left connecting the connectors, L 3 and L 2 screws and were locked down in situ.  The posterolateral region was packed on the right, including the decorticated right L 23 facet with 30  cc bone graft on the and remaining BMP.   The wound was irrigated.  Fascia was closed with 1 Vicryl sutures skin edges were reapproximated 2 and 3-0 Vicryl sutures. The wound was dressed with Dermabond and  an occlusive dressing the patient was extubated in the operating room and taken to recovery in stable satisfactory condition.  He tolerated the operation well.  Counts were  correct at the end of the case.   PLAN OF CARE: Admit to inpatient   PATIENT DISPOSITION:  PACU - hemodynamically stable.   Delay start of Pharmacological VTE agent (>24hrs) due to surgical blood loss or risk of bleeding: yes

## 2020-11-27 NOTE — Progress Notes (Signed)
Called Dr. Venetia Maxon regarding new onset hematuria. It is now clearing up. Orders to leave foley for a little while and make sure it continues to clear up. As long as it clears up, can remove foley when pt is up and ambulating. If it causes him discomfort can also remove.   Jacobo Forest, RN

## 2020-11-27 NOTE — Interval H&P Note (Signed)
History and Physical Interval Note:  11/27/2020 7:28 AM  Brian Franco  has presented today for surgery, with the diagnosis of Spinal stenosis, Lumbar region with neurogenic claudication.  The various methods of treatment have been discussed with the patient and family. After consideration of risks, benefits and other options for treatment, the patient has consented to  Procedure(s) with comments: Left Lumbar 2-3 Transforaminal lumbar interbody fusion with exploration of adjacent level fixation (Left) - 3C/RM 21 as a surgical intervention.  The patient's history has been reviewed, patient examined, no change in status, stable for surgery.  I have reviewed the patient's chart and labs.  Questions were answered to the patient's satisfaction.     [name]

## 2020-11-27 NOTE — Anesthesia Procedure Notes (Signed)
Procedure Name: Intubation Date/Time: 11/27/2020 7:43 AM Performed by: Marena Chancy, CRNA Pre-anesthesia Checklist: Patient identified, Emergency Drugs available, Suction available and Patient being monitored Patient Re-evaluated:Patient Re-evaluated prior to induction Oxygen Delivery Method: Circle System Utilized Preoxygenation: Pre-oxygenation with 100% oxygen Induction Type: IV induction Ventilation: Oral airway inserted - appropriate to patient size and Two handed mask ventilation required Laryngoscope Size: Miller and 3 Grade View: Grade II Tube type: Oral Tube size: 8.0 mm Number of attempts: 1 Airway Equipment and Method: Stylet and Oral airway Placement Confirmation: ETT inserted through vocal cords under direct vision, positive ETCO2 and breath sounds checked- equal and bilateral Tube secured with: Tape Dental Injury: Teeth and Oropharynx as per pre-operative assessment

## 2020-11-28 ENCOUNTER — Encounter (HOSPITAL_COMMUNITY): Payer: Self-pay | Admitting: Neurosurgery

## 2020-11-28 LAB — CBC
HCT: 40.2 % (ref 39.0–52.0)
Hemoglobin: 13.4 g/dL (ref 13.0–17.0)
MCH: 30.9 pg (ref 26.0–34.0)
MCHC: 33.3 g/dL (ref 30.0–36.0)
MCV: 92.8 fL (ref 80.0–100.0)
Platelets: 233 10*3/uL (ref 150–400)
RBC: 4.33 MIL/uL (ref 4.22–5.81)
RDW: 12.6 % (ref 11.5–15.5)
WBC: 19.9 10*3/uL — ABNORMAL HIGH (ref 4.0–10.5)
nRBC: 0 % (ref 0.0–0.2)

## 2020-11-28 LAB — BASIC METABOLIC PANEL
Anion gap: 13 (ref 5–15)
BUN: 19 mg/dL (ref 8–23)
CO2: 24 mmol/L (ref 22–32)
Calcium: 9.1 mg/dL (ref 8.9–10.3)
Chloride: 99 mmol/L (ref 98–111)
Creatinine, Ser: 0.87 mg/dL (ref 0.61–1.24)
GFR, Estimated: >60 mL/min (ref >60)
Glucose, Bld: 136 mg/dL — ABNORMAL HIGH (ref 70–99)
Potassium: 3.9 mmol/L (ref 3.5–5.1)
Sodium: 136 mmol/L (ref 135–145)

## 2020-11-28 MED ORDER — HYDRALAZINE HCL 20 MG/ML IJ SOLN
10.0000 mg | INTRAMUSCULAR | Status: DC | PRN
  Administered 2020-11-28 – 2020-11-29 (×2): 10 mg via INTRAVENOUS
  Filled 2020-11-28 (×2): qty 1

## 2020-11-28 MED ORDER — PREGABALIN 50 MG PO CAPS
50.0000 mg | ORAL_CAPSULE | Freq: Two times a day (BID) | ORAL | Status: DC
  Administered 2020-11-28 – 2020-11-29 (×3): 50 mg via ORAL
  Filled 2020-11-28 (×3): qty 1

## 2020-11-28 MED ORDER — CYCLOBENZAPRINE HCL 10 MG PO TABS
5.0000 mg | ORAL_TABLET | Freq: Once | ORAL | Status: AC
  Administered 2020-11-28: 5 mg via ORAL
  Filled 2020-11-28: qty 1

## 2020-11-28 NOTE — Progress Notes (Signed)
NAME:  Brian Franco, MRN:  [id number], DOB:  May 16, 1955, LOS: 0 ADMISSION DATE:  11/27/2020, CONSULTATION DATE:  7;21 REFERRING MD:  stern, CHIEF COMPLAINT:  HTN   History of Present Illness:  This is a 30 yom w/ h/o chronic back pain and spinal stenosis. Failed outpt treatment for pain and paresthesia so presented to [facility] 7/21 for planned L2,-L3 Lumbar fixation and fusion. Case uneventful. Returned to PACU w/ progressive hypertension w/ peak BP 229/108. Remained elevated in spite of hydralazine so placed on cleviprex gtt. PCCM asked to assist w/ BP management  Pertinent  Medical History  Spinal stenosis HTN recorded in office Chronic back pain, s/p 2 lumbar steroid injections 2022, prior lumbar surgeries. Tobacco abuse (stopped 2 mo prior to admit)   Significant Hospital Events: Including procedures, antibiotic start and stop dates in addition to other pertinent events   7/21 s/p l2-L3 lumbar surg. Post op Hypertension. Started on Cleviprex PCCM asked to see   Interim History / Subjective:  Awake this morning laying in bed. Notes spasms in legs. Pain medication helps somewhat. No sensory changes or weakness. Tolerated diet. Foley out, voiding spontaneously, passing flatus.  Objective   Blood pressure (!) 155/70, pulse 69, temperature 98.6 F (37 C), temperature source Oral, resp. rate 18, height 6\' 4"  (1.93 m), weight 113.3 kg, SpO2 94 %.        Intake/Output Summary (Last 24 hours) at 11/28/2020 0740 Last data filed at 11/28/2020 0700 Gross per 24 hour  Intake 2733.07 ml  Output 3450 ml  Net -716.93 ml   Filed Weights   11/27/20 0604  Weight: 113.3 kg    Examination: Examination: General: well developed mildly uncomfortable   HENT: NCAT no JVD MMM Lungs: clear and w/out accessory use. No wheezing Cardiovascular: RRR w/ no MRG  Abdomen: soft not tender, active bs Extremities: warm and dry Neuro: awake and oriented, no focal weakness, surgical sign clean and  dressed GU: foley removed  Resolved Hospital Problem list     Assessment & Plan:  Acute Post-operative Hypertension BP improved to 140/70 this morning, has been off drip. Outpatient bp of 130/70 suspect he may have pre hypertension at baseline in addition to pain and recent surgery contributing to hypertension. Continues to have leg spasms anticipate BP will improve with better pain control  - Robaxin for spasms - Continue oxycodone 5 mg q3 PRN for pain, norco q4 PRN severe pain, and Dilaudid 0.5 mg IV q2 PRN for severe pain -Start lyrica  -Discussed lifestyle modification including continued smoking cessation, diet, exercise, weight loss for prediabetes  - Would avoid starting on antihypertensive treatments at this time, will need reevaluation in outpatient setting after discharge - Stable for transfer out of ICU will discuss with neurosurgery  S/p lumbar surgery Plan Per neurosurg teal   H/o BPH Plan Flomax   H/o HL Plan Cont fenofiberate   Best Practice (right click and "Reselect all SmartList Selections" daily)   Diet/type: Regular consistency (see orders) DVT prophylaxis:  GI prophylaxis: PPI Lines: N/A Foley:  N/A Code Status:  full code Last date of multidisciplinary goals of care discussion []   Labs   CBC: Recent Labs  Lab 11/24/20 0946  WBC 11.1*  HGB 15.0  HCT 44.8  MCV 92.9  PLT 247    Basic Metabolic Panel: No results for input(s): NA, K, CL, CO2, GLUCOSE, BUN, CREATININE, CALCIUM, MG, PHOS in the last 168 hours. GFR: CrCl cannot be calculated (Patient's most recent lab result  is older than the maximum 21 days allowed.). Recent Labs  Lab 11/24/20 0946  WBC 11.1*    Liver Function Tests: No results for input(s): AST, ALT, ALKPHOS, BILITOT, PROT, ALBUMIN in the last 168 hours. No results for input(s): LIPASE, AMYLASE in the last 168 hours. No results for input(s): AMMONIA in the last 168 hours.  ABG No results found for: PHART, PCO2ART,  PO2ART, HCO3, TCO2, ACIDBASEDEF, O2SAT   Coagulation Profile: No results for input(s): INR, PROTIME in the last 168 hours.  Cardiac Enzymes: No results for input(s): CKTOTAL, CKMB, CKMBINDEX, TROPONINI in the last 168 hours.  HbA1C: No results found for: HGBA1C  CBG: Recent Labs  Lab 11/27/20 1746  GLUCAP 169*    Past Medical History:  He,  has a past medical history of History of kidney stones, Lumbar stenosis with neurogenic claudication, and Sleep apnea.   Surgical History:   Past Surgical History:  Procedure Laterality Date   ANTERIOR LAT LUMBAR FUSION Left 04/03/2019   Procedure: Left Lumbar Three-Four Anterolateral interbody fusion with lateral plate;  Surgeon: Maeola Harman, MD;  Location: [facility] OR;  Service: Neurosurgery;  Laterality: Left;  Left Lumbar Three-Four Anterolateral interbody fusion with lateral plate   APPENDECTOMY     BACK SURGERY     Laminectomy L5-S1 - x 3   CARPAL TUNNEL RELEASE Right 07/17/2019   Procedure: Right carpal tunnel release;  Surgeon: Maeola Harman, MD;  Location: [facility] OR;  Service: Neurosurgery;  Laterality: Right;   CARPAL TUNNEL RELEASE Left    COLONOSCOPY     KNEE ARTHROSCOPY     Right   WISDOM TOOTH EXTRACTION       Social History:   reports that he quit smoking about 2 months ago. His smoking use included cigarettes. He has a 64.50 pack-year smoking history. He has never used smokeless tobacco. He reports current alcohol use. He reports that he does not use drugs.   Family History:  His family history is not on file.   Allergies No Known Allergies   Home Medications  Prior to Admission medications   Medication Sig Start Date End Date Taking? Authorizing Provider  Barberry-Oreg Grape-Goldenseal (BERBERINE COMPLEX PO) Take 1 tablet by mouth daily.   Yes [provider]  citalopram (CELEXA) 40 MG tablet Take 40 mg by mouth daily. 09/01/20  Yes [provider]  fenofibrate 160 MG tablet Take 160 mg by mouth daily.    Yes [provider]  ibuprofen (ADVIL) 800 MG tablet Take 800 mg by mouth every 6 (six) hours as needed for moderate pain.   Yes [provider]  oxyCODONE-acetaminophen (PERCOCET) 7.5-325 MG tablet Take 1 tablet by mouth 4 (four) times daily as needed (severe pain).    Yes [provider]  tamsulosin (FLOMAX) 0.4 MG CAPS capsule Take 0.4 mg by mouth daily.   Yes [provider]     Quincy Simmonds, MD Internal Medicine, PGY-2 11/29/2210:22 PM

## 2020-11-28 NOTE — Evaluation (Signed)
Physical Therapy Evaluation Patient Details Name: Brian Franco MRN: [id number] DOB: 17-Feb-1956 Today's Date: 11/28/2020   History of Present Illness  Pt s/p left Lumbar 2-3 Transforaminal lumbar interbody fusion with exploration of adjacent level fixation. Post surgery pt with progressive HTN. PMH - back surgery x 5,  Clinical Impression  Pt presents to PT with expected decr in mobility s/p lumbar fusion. Should make good progress and be able to return home with wife.     Follow Up Recommendations No PT follow up;Supervision - Intermittent    Equipment Recommendations  None recommended by PT    Recommendations for Other Services       Precautions / Restrictions Precautions Precautions: Back Precaution Booklet Issued: Yes (comment) Required Braces or Orthoses: Spinal Brace Spinal Brace: Lumbar corset;Applied in sitting position Restrictions Weight Bearing Restrictions: No      Mobility  Bed Mobility Overal bed mobility: Needs Assistance Bed Mobility: Sit to Sidelying Rolling: Supervision Sidelying to sit: Supervision     Sit to sidelying: Supervision General bed mobility comments: Verbal cues for technique    Transfers Overall transfer level: Needs assistance Equipment used: None;Rolling walker (2 wheeled) Transfers: Sit to/from Stand Sit to Stand: Min guard         General transfer comment: Assist for safety with or without walker  Ambulation/Gait Ambulation/Gait assistance: Min guard Gait Distance (Feet): 225 Feet Assistive device: Rolling walker (2 wheeled);None Gait Pattern/deviations: Step-through pattern;Decreased stride length;Trunk flexed Gait velocity: decr Gait velocity interpretation: 1.31 - 2.62 ft/sec, indicative of limited community ambulator General Gait Details: Assist for safety. Verbal cues to stand more erect. Better stability with use of walker  Stairs            Wheelchair Mobility    Modified Rankin (Stroke Patients  Only)       Balance Overall balance assessment: Mild deficits observed, not formally tested                                           Pertinent Vitals/Pain Pain Assessment: Faces Faces Pain Scale: Hurts little more Pain Location: back of both legs and incisional site Pain Descriptors / Indicators: Aching;Sore Pain Intervention(s): Limited activity within patient's tolerance;Repositioned    Home Living Family/patient expects to be discharged to:: Private residence Living Arrangements: Spouse/significant other;Children (wife and son) Available Help at Discharge: Family;Available 24 hours/day Type of Home: House Home Access: Stairs to enter Entrance Stairs-Rails: None Entrance Stairs-Number of Steps: 2 Home Layout: One level Home Equipment: Bedside commode;Shower seat - built in;Hand held Careers information officer - 2 wheels      Prior Function Level of Independence: Independent               Hand Dominance   Dominant Hand: Right    Extremity/Trunk Assessment   Upper Extremity Assessment Upper Extremity Assessment: Defer to OT evaluation    Lower Extremity Assessment Lower Extremity Assessment: Overall WFL for tasks assessed       Communication   Communication: No difficulties  Cognition Arousal/Alertness: Awake/alert Behavior During Therapy: WFL for tasks assessed/performed Overall Cognitive Status: Within Functional Limits for tasks assessed                                        General Comments  Exercises     Assessment/Plan    PT Assessment Patient needs continued PT services  PT Problem List Decreased mobility;Pain       PT Treatment Interventions DME instruction;Gait training;Stair training;Functional mobility training;Therapeutic activities;Therapeutic exercise;Patient/family education    PT Goals (Current goals can be found in the Care Plan section)  Acute Rehab PT Goals Patient Stated Goal: return  home PT Goal Formulation: With patient Time For Goal Achievement: 12/12/20 Potential to Achieve Goals: Good    Frequency Min 5X/week   Barriers to discharge        Co-evaluation               AM-PAC PT "6 Clicks" Mobility  Outcome Measure Help needed turning from your back to your side while in a flat bed without using bedrails?: None Help needed moving from lying on your back to sitting on the side of a flat bed without using bedrails?: A Little Help needed moving to and from a bed to a chair (including a wheelchair)?: A Little Help needed standing up from a chair using your arms (e.g., wheelchair or bedside chair)?: A Little Help needed to walk in hospital room?: A Little Help needed climbing 3-5 steps with a railing? : A Little 6 Click Score: 19    End of Session Equipment Utilized During Treatment: Back brace Activity Tolerance: Patient tolerated treatment well Patient left: in chair;in bed;with call bell/phone within reach;with family/visitor present (Left in chair initially with pt requesting back to bed ~40 minutes later) Nurse Communication: Mobility status PT Visit Diagnosis: Other abnormalities of gait and mobility (R26.89);Pain Pain - Right/Left:  (bilateral) Pain - part of body: Leg    Time: 1517-6160 PT Time Calculation (min) (ACUTE ONLY): 21 min   Charges:   PT Evaluation $PT Eval Moderate Complexity: 1 Mod          [facility] PT Acute Rehabilitation Services Pager [phone] Office [phone]   Angelina Ok [facility] 11/28/2020, 2:44 PM

## 2020-11-28 NOTE — Evaluation (Signed)
Occupational Therapy Evaluation Patient Details Name: Brian Franco MRN: [id number] DOB: 16-May-1955 Today's Date: 11/28/2020    History of Present Illness Pt s/p left Lumbar 2-3 Transforaminal lumbar interbody fusion with exploration of adjacent level fixation. Post surgery pt with progressive HTN.   Clinical Impression   This 65 yo male admitted and underwent above presents to acute OT with PLOF of being totally independent with basic ADLs. Currently he is setup/ Mod A for basic ADLs. He has wife and son (alternately at home to A him) and he has all needed DME. He will benefit from acute OT without need for follow up.    Follow Up Recommendations  No OT follow up;Supervision - Intermittent    Equipment Recommendations  None recommended by OT           Precautions / Restrictions Precautions Precautions: Back Precaution Booklet Issued: Yes (comment) Restrictions Weight Bearing Restrictions: No      Mobility Bed Mobility Overal bed mobility: Modified Independent Bed Mobility: Rolling;Sidelying to Sit Rolling: Supervision Sidelying to sit: Supervision       General bed mobility comments: VCs for sequencing    Transfers Overall transfer level: Needs assistance Equipment used: None Transfers: Sit to/from Stand Sit to Stand: Min guard              Balance Overall balance assessment: Mild deficits observed, not formally tested (in standing)                                         ADL either performed or assessed with clinical judgement   ADL Overall ADL's : Needs assistance/impaired Eating/Feeding: Independent;Sitting   Grooming: Set up;Sitting   Upper Body Bathing: Set up;Sitting   Lower Body Bathing: Moderate assistance Lower Body Bathing Details (indicate cue type and reason): min guard A sit<>stand Upper Body Dressing : Set up;Sitting   Lower Body Dressing: Moderate assistance Lower Body Dressing Details (indicate cue type and  reason): min guard A sit<>stand Toilet Transfer: Min guard;Ambulation;RW   Toileting- Architect and Hygiene: Min guard;Sit to/from stand               Vision Patient Visual Report: No change from baseline              Pertinent Vitals/Pain Pain Assessment: Faces Faces Pain Scale: Hurts little more Pain Location: back of both legs and incisional site Pain Descriptors / Indicators: Aching;Sore Pain Intervention(s): Limited activity within patient's tolerance;Monitored during session;Repositioned     Hand Dominance Right   Extremity/Trunk Assessment Upper Extremity Assessment Upper Extremity Assessment: Overall WFL for tasks assessed           Communication Communication Communication: No difficulties   Cognition Arousal/Alertness: Awake/alert Behavior During Therapy: WFL for tasks assessed/performed Overall Cognitive Status: Within Functional Limits for tasks assessed                                                Home Living Family/patient expects to be discharged to:: Private residence Living Arrangements: Spouse/significant other;Children (wife and son) Available Help at Discharge: Family;Available 24 hours/day Type of Home: House Home Access: Stairs to enter [employer] of Steps: 2 Entrance Stairs-Rails: None Home Layout: One level     Bathroom Shower/Tub: Walk-in shower  Bathroom Toilet: Standard     Home Equipment: Bedside commode;Shower seat - built in;Hand held shower head          Prior Functioning/Environment Level of Independence: Independent                 OT Problem List: Decreased range of motion;Impaired balance (sitting and/or standing);Pain;Decreased knowledge of precautions      OT Treatment/Interventions: Self-care/ADL training;DME and/or AE instruction;Patient/family education;Balance training    OT Goals(Current goals can be found in the care plan section) Acute Rehab OT  Goals OT Goal Formulation: With patient Time For Goal Achievement: 12/12/20 Potential to Achieve Goals: Good  OT Frequency: Min 2X/week    AM-PAC OT "6 Clicks" Daily Activity     Outcome Measure Help from another person eating meals?: None Help from another person taking care of personal grooming?: A Little Help from another person toileting, which includes using toliet, bedpan, or urinal?: A Little Help from another person bathing (including washing, rinsing, drying)?: A Lot Help from another person to put on and taking off regular upper body clothing?: A Little Help from another person to put on and taking off regular lower body clothing?: A Lot 6 Click Score: 17   End of Session Equipment Utilized During Treatment: Back brace  Activity Tolerance: Patient tolerated treatment well Patient left:  (sitting EOB getting ready to work with PT)  OT Visit Diagnosis: Unsteadiness on feet (R26.81);Other abnormalities of gait and mobility (R26.89);Muscle weakness (generalized) (M62.81);Pain Pain - Right/Left:  (both) Pain - part of body: Leg (and incisional site)                Time: 8250-5397 OT Time Calculation (min): 14 min Charges:  OT General Charges $OT Visit: 1 Visit OT Evaluation $OT Eval Moderate Complexity: 1 Mod  Brian Franco, OTR/L Acute [employer] Pager [phone] Office [phone]    Evette Georges 11/28/2020, 11:39 AM

## 2020-11-28 NOTE — Progress Notes (Signed)
eLink Physician-Brief Progress Note Patient Name: Brian Franco DOB: November 23, 1955 MRN: [id number]   Date of Service  11/28/2020  HPI/Events of Note  Patient c/o back pain d/t muscle spasm. Last dose of Robaxin at 6 PM.   eICU Interventions  Plan: Flexeril 5 gm PO now.      Intervention Category Major Interventions: Other:  Lenell Antu 11/28/2020, 8:17 PM

## 2020-11-28 NOTE — Progress Notes (Signed)
eLink Physician-Brief Progress Note Patient Name: Brian Franco DOB: 11-06-1955 MRN: [id number]   Date of Service  11/28/2020  HPI/Events of Note  Hypertension - BP = 196/79.  eICU Interventions  Plan: Hydralazine 10 mg IV Q 4 hours PRN SBP > 170 or DBP > 100.      Intervention Category Major Interventions: Hypertension - evaluation and management  Lou Loewe Eugene 11/28/2020, 10:12 PM

## 2020-11-28 NOTE — Progress Notes (Signed)
OT Cancellation Note  Patient Details Name: Brian Franco MRN: [id number] DOB: [birth year]   Cancelled Treatment:    Reason Eval/Treat Not Completed: Other (comment). Pt prefers to eat his breakfast before getting up with therapy. Offered to set it up for him and he reported his wife is on the way up and she will help him. Will return later time for eval.  Ignacia Palma, OTR/L Acute Rehab Services Pager [phone] Office [phone]    Evette Georges 11/28/2020, 8:46 AM

## 2020-11-28 NOTE — Progress Notes (Addendum)
Subjective: Patient had episodes of intractable HTN yesterday while in the PACU. Peak SBP of 229. Patient was started on a Cleviprex gtt while in PACU and transferred to the ICU. CCM was consulted to aid in BP management.   Patient reports that he is doing well overall. He reports minimal back pain. He has posterior thigh pain that he reports as severe. BPs are under better control today and the Cleviprex gtt has been stopped. He reports no h/o HTN or use of antihypertensives prior to admission.   Objective: Vital signs in last 24 hours: Temp:  [97.5 F (36.4 C)-98.9 F (37.2 C)] 98.6 F (37 C) (07/22 0734) Pulse Rate:  [61-90] 69 (07/22 0700) Resp:  [11-31] 18 (07/22 0700) BP: (113-229)/(52-108) 155/70 (07/22 0635) SpO2:  [84 %-98 %] 94 % (07/22 0700)  Intake/Output from previous day: 07/21 0701 - 07/22 0700 In: 2733.1 [P.O.:240; I.V.:1793.1; IV Piggyback:700] Out: 3450 [Urine:3250; Blood:200] Intake/Output this shift: No intake/output data recorded.  Physical Exam: Patient is awake, A/O X 4, conversant, and in good spirits. He is in NAD and VSS. SBP in the 160's, currently not on a Cleviprex gtt. Speech is fluent and appropriate. Doing well. MAEW with good strength. Left EHL 4/5, Right EHL 5/5. Sensation to light touch is intact. PERLA, EOMI. CNs grossly intact. Dressing is clean dry intact. Incision is well approximated with no drainage, erythema, or edema.    Dressing is clean dry intact. Incision is well approximated with no drainage, erythema, or edema.  LSO brace in place   Lab Results: Recent Labs    11/28/20 0632  WBC 19.9*  HGB 13.4  HCT 40.2  PLT 233   BMET No results for input(s): NA, K, CL, CO2, GLUCOSE, BUN, CREATININE, CALCIUM in the last 72 hours.  Studies/Results: DG Lumbar Spine 2-3 Views  Result Date: 11/27/2020 CLINICAL DATA:  Elective surgery. Additional history provided: L2-3 TLIF. Provided fluoroscopy time 40 seconds (31.63 mGy). EXAM: LUMBAR  SPINE - 2-3 VIEW; DG C-ARM 1-60 MIN COMPARISON:  Lumbar spine radiographs 06/17/2020. FINDINGS: AP and lateral view intraoperative fluoroscopic images of the lumbar spine are submitted, 2 images total. On the provided images, bilateral pedicle screws are now present at the L2 and L3 levels. Vertical interconnecting rods were not present at the time the images were taken. Interbody device(s) also now present at L2-L3. Incompletely imaged and incompletely assessed spinal fusion hardware at the more caudal levels. Overlying retractors. IMPRESSION: Two intraoperative fluoroscopic images of the lumbar spine, as described. Electronically Signed   By: Jackey Loge DO   On: 11/27/2020 11:24   DG Chest Port 1 View  Result Date: 11/27/2020 CLINICAL DATA:  Dyspnea EXAM: PORTABLE CHEST 1 VIEW COMPARISON:  11/27/2020 FINDINGS: Discoid atelectasis at the right lung base again noted. Lung volumes are small. Mild left basilar atelectasis. No pneumothorax or pleural effusion. Cardiac size within normal limits. No acute bone abnormality. IMPRESSION: Stable pulmonary hypoinflation with bibasilar atelectasis. Electronically Signed   By: Helyn Numbers MD   On: 11/27/2020 19:20   DG C-Arm 1-60 Min  Result Date: 11/27/2020 CLINICAL DATA:  Elective surgery. Additional history provided: L2-3 TLIF. Provided fluoroscopy time 40 seconds (31.63 mGy). EXAM: LUMBAR SPINE - 2-3 VIEW; DG C-ARM 1-60 MIN COMPARISON:  Lumbar spine radiographs 06/17/2020. FINDINGS: AP and lateral view intraoperative fluoroscopic images of the lumbar spine are submitted, 2 images total. On the provided images, bilateral pedicle screws are now present at the L2 and L3 levels. Vertical interconnecting rods were  not present at the time the images were taken. Interbody device(s) also now present at L2-L3. Incompletely imaged and incompletely assessed spinal fusion hardware at the more caudal levels. Overlying retractors. IMPRESSION: Two intraoperative fluoroscopic  images of the lumbar spine, as described. Electronically Signed   By: Jackey Loge DO   On: 11/27/2020 11:24    Assessment/Plan: Patient is post-op day 1 s/p Left L2-3 TLIF with exploration of adjacent level fusion. He had episodes of intractable HTN in PACU yesterday with peak SBP 229. He was subsequently started on a Cleviprex gtt and transferred to the ICU. CCM was consulted to aid in BP management. Today his BPs appear to be better controlled, although still elevated. From a neurosurgical perspective, he is recovering well from his surgery. His biggest complaint is of bilateral posterior thigh pain. He has no c/o lumbar or incisional pain. Awaiting PT/OT evaluation. He is ok to transfer out of the ICU once CCM deems it appropriate.   -Add Lyrica for posterior thigh pain -Continue LSO brace when OOB.  -Begin working with PT/OT -Continue working on pain control, mobility and ambulating patient.  -Will plan for transfer/discharge once CCM feels his BPs are under control and is medically ready.  LOS: 1 day     Council Mechanic, DNP, NP-C 11/28/2020, 8:13 AM  Addendum:  Patient seen and examined.  Agree with above.  Doing very well.  BP controlled.  Plan for discharge tomorrow.   Thank you for allowing me to participate in this patient's care.  Please do not hesitate to call with questions or concerns.   Monia Pouch, DO Neurosurgeon [facility] Cell: [phone]

## 2020-11-29 MED ORDER — CLONIDINE HCL 0.1 MG PO TABS
0.1000 mg | ORAL_TABLET | Freq: Three times a day (TID) | ORAL | Status: DC
  Administered 2020-11-29: 0.1 mg via ORAL
  Filled 2020-11-29: qty 1

## 2020-11-29 MED ORDER — METHOCARBAMOL 500 MG PO TABS: 500.0000 mg | ORAL_TABLET | Freq: Four times a day (QID) | ORAL | 3 refills | Status: DC | PRN

## 2020-11-29 MED ORDER — PREGABALIN 50 MG PO CAPS: 50.0000 mg | ORAL_CAPSULE | Freq: Two times a day (BID) | ORAL | 2 refills | Status: DC

## 2020-11-29 MED ORDER — CLONIDINE HCL 0.1 MG PO TABS
0.1000 mg | ORAL_TABLET | Freq: Two times a day (BID) | ORAL | 11 refills | Status: DC
Start: 2020-11-29 — End: 2023-04-13

## 2020-11-29 MED ORDER — OXYCODONE-ACETAMINOPHEN 7.5-325 MG PO TABS: 1.0000 | ORAL_TABLET | Freq: Four times a day (QID) | ORAL | 0 refills | Status: DC | PRN

## 2020-11-29 NOTE — Progress Notes (Signed)
Patient ID: Brian Franco, male   DOB: July 01, 1955, 65 y.o.   MRN: [id number] Vital signs are stable and blood pressures come under good control.  Patient notes that pain seems to be in litigator for his blood pressure.  He has been tolerating Percocet well.  At the current time we will plan on discharging him home with a prescription for Catapres.  His incision is clean and dry and his motor function is good.  Station and gait are intact.  Both he and his wife are eager to be discharged home and I believe it safe at this time.

## 2020-11-29 NOTE — Progress Notes (Addendum)
eLink Physician-Brief Progress Note Patient Name: Brian Franco DOB: [birth year] MRN: [id number]   Date of Service  11/29/2020  HPI/Events of Note  Hypertension - BP = 163/81 with MAP = 104.  eICU Interventions  Plan: Catapres 0.1 mg PO now and TID. Hold dose for SBP < 110.      Intervention Category Major Interventions: Hypertension - evaluation and management  Breunna Nordmann Eugene 11/29/2020, 4:23 AM

## 2020-11-29 NOTE — Discharge Summary (Signed)
Physician Discharge Summary  Patient ID: Brian Franco MRN: [id number] DOB/AGE: 1955-07-06 65 y.o.  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  Herniated nucleus pulposus.  Lumbar radiculopathy.  History of adjacent level fusion. Active Problems:   Herniated lumbar disc without myelopathy   Discharged Condition: fair  Hospital Course: Patient was admitted to undergo surgical decompression and stabilization of L2-L3.  Postoperatively he had significant hypertension.  He was maintained in intensive care unit and his blood pressure was gradually brought under control with parenteral medications.  He is discharged home on clonidine.  Consults: pulmonary/intensive care  Significant Diagnostic Studies: None  Treatments: surgery: See op note  Discharge Exam: Blood pressure (!) 146/76, pulse 71, temperature 98.4 F (36.9 C), temperature source Oral, resp. rate (!) 21, height 6\' 4"  (1.93 m), weight 113.3 kg, SpO2 95 %. Incision is clean and dry.  Station and gait are intact.  Disposition: Discharge disposition: 01-Home or Self Care       Discharge Instructions     Call MD for:  redness, tenderness, or signs of infection (pain, swelling, redness, odor or green/yellow discharge around incision site)   Complete by: As directed    Call MD for:  severe uncontrolled pain   Complete by: As directed    Call MD for:  temperature >100.4   Complete by: As directed    Diet - low sodium heart healthy   Complete by: As directed    Discharge wound care:   Complete by: As directed    Patient may shower.  Remove honeycomb dressing after second day at home.  Leave open to air.  Do not apply salves or ointments to the incision.  Sit straight walk straight stand straight mind your posture.   Increase activity slowly   Complete  by: As directed       Allergies as of 11/29/2020   No Known Allergies      Medication List     TAKE these medications    BERBERINE COMPLEX PO Take 1 tablet by mouth daily.   citalopram 40 MG tablet Commonly known as: CELEXA Take 40 mg by mouth daily.   cloNIDine 0.1 MG tablet Commonly known as: CATAPRES Take 1 tablet (0.1 mg total) by mouth 2 (two) times daily.   fenofibrate 160 MG tablet Take 160 mg by mouth daily.   ibuprofen 800 MG tablet Commonly known as: ADVIL Take 800 mg by mouth every 6 (six) hours as needed for moderate pain.   methocarbamol 500 MG tablet Commonly known as: ROBAXIN Take 1 tablet (500 mg total) by mouth every 6 (six) hours as needed for muscle spasms.   oxyCODONE-acetaminophen 7.5-325 MG tablet Commonly known as: PERCOCET Take 1 tablet by mouth 4 (four) times daily as needed (severe pain).   pregabalin 50 MG capsule Commonly known as: LYRICA Take 1 capsule (50 mg total) by mouth 2 (two) times daily.   tamsulosin 0.4 MG Caps capsule Commonly known as: FLOMAX Take 0.4 mg by mouth daily.               Discharge Care Instructions  (From admission, onward)           Start     Ordered   11/29/20 0000  Discharge wound care:       Comments: Patient may shower.  Remove honeycomb dressing after second day at home.  Leave open to air.  Do not apply salves or ointments to the incision.  Sit straight walk straight stand straight mind your posture.   11/29/20 1779             Signed: [name] 11/29/2020, 9:56 AM

## 2020-11-29 NOTE — Progress Notes (Signed)
Physical Therapy Treatment Patient Details Name: Brian Franco MRN: [id number] DOB: [birth year] Today's Date: 11/29/2020    History of Present Illness Pt s/p left Lumbar 2-3 Transforaminal lumbar interbody fusion with exploration of adjacent level fixation. Post surgery pt with progressive HTN. PMH - back surgery x 5,    PT Comments    Pt pleasant and reports being hot without air in room. Pt able to progress transfers and gait with no physical assist required and pt able to don brace. Wife present during session and able to assist at home. Pt with SpO2 91-92% on RA with BP 168/89 in sitting. Pt able to verbalize all precautions and demonstrate adherence with mobility.     Follow Up Recommendations  No PT follow up;Supervision - Intermittent     Equipment Recommendations  None recommended by PT    Recommendations for Other Services       Precautions / Restrictions Precautions Precautions: Back;Other (comment) Precaution Comments: watch BP and sats Required Braces or Orthoses: Spinal Brace Spinal Brace: Lumbar corset;Applied in sitting position    Mobility  Bed Mobility Overal bed mobility: Modified Independent                  Transfers Overall transfer level: Modified independent                  Ambulation/Gait Ambulation/Gait assistance: Supervision Gait Distance (Feet): 250 Feet Assistive device: Rolling walker (2 wheeled);None Gait Pattern/deviations: Step-through pattern;Decreased stride length;Trunk flexed   Gait velocity interpretation: >2.62 ft/sec, indicative of community ambulatory General Gait Details: cues for posture with RW use. Used RW for 100' then none for 150' with improved posture without RW this session   Stairs Stairs: Yes Stairs assistance: Modified independent (Device/Increase time) Stair Management: Step to pattern;Forwards Number of Stairs: 2     Wheelchair Mobility    Modified Rankin (Stroke Patients Only)        Balance Overall balance assessment: No apparent balance deficits (not formally assessed)                                          Cognition Arousal/Alertness: Awake/alert Behavior During Therapy: WFL for tasks assessed/performed Overall Cognitive Status: Within Functional Limits for tasks assessed                                        Exercises      General Comments        Pertinent Vitals/Pain Pain Score: 5  Pain Location: back Pain Descriptors / Indicators: Aching;Sore Pain Intervention(s): Limited activity within patient's tolerance;Monitored during session;Premedicated before session;Repositioned    Home Living                      Prior Function            PT Goals (current goals can now be found in the care plan section) Progress towards PT goals: Progressing toward goals    Frequency    Min 5X/week      PT Plan Current plan remains appropriate    Co-evaluation              AM-PAC PT "6 Clicks" Mobility   Outcome Measure  Help needed turning from your back to your side while in a  flat bed without using bedrails?: None Help needed moving from lying on your back to sitting on the side of a flat bed without using bedrails?: None Help needed moving to and from a bed to a chair (including a wheelchair)?: None Help needed standing up from a chair using your arms (e.g., wheelchair or bedside chair)?: None Help needed to walk in hospital room?: A Little Help needed climbing 3-5 steps with a railing? : A Little 6 Click Score: 22    End of Session Equipment Utilized During Treatment: Back brace Activity Tolerance: Patient tolerated treatment well Patient left: in chair;in bed;with call bell/phone within reach;with family/visitor present Nurse Communication: Mobility status PT Visit Diagnosis: Other abnormalities of gait and mobility (R26.89);Pain     Time: 4540-9811 PT Time Calculation (min) (ACUTE ONLY):  19 min  Charges:  $Gait Training: 8-22 mins                     Merryl Hacker, PT Acute Rehabilitation Services Pager: [phone] Office: [phone]    Brian Franco Brian Franco 11/29/2020, 10:31 AM

## 2020-12-01 NOTE — Anesthesia Postprocedure Evaluation (Signed)
Anesthesia Post Note  Patient: DONYAE KILNER  Procedure(s) Performed: Left Lumbar Two-Three Transforaminal lumbar interbody fusion with exploration of adjacent level fixation (Left: Back)     Patient location during evaluation: PACU Anesthesia Type: General Level of consciousness: awake and alert Pain management: pain level controlled Vital Signs Assessment: post-procedure vital signs reviewed and stable Respiratory status: spontaneous breathing, nonlabored ventilation, respiratory function stable and patient connected to nasal cannula oxygen Cardiovascular status: blood pressure returned to baseline and stable Postop Assessment: no apparent nausea or vomiting Anesthetic complications: no   No notable events documented.  Last Vitals:  Vitals:   11/29/20 1100 11/29/20 1200  BP: (!) 169/81 135/60  Pulse: 65 68  Resp: (!) 21 (!) 23  Temp:    SpO2: 90% 91%    Last Pain:  Vitals:   11/29/20 1000  TempSrc:   PainSc: 2                  Chau Sawin S

## 2021-01-02 IMAGING — RF DG LUMBAR SPINE 2-3V
1 series · 2 of 2 positions shown · non-contrast
Comparison: MRI lumbar spine 02/21/2019

CLINICAL DATA: XLIF, left lumbar 3-4 anterolateral interbody fusion
with lateral plate RSTO.

EXAM:
LUMBAR SPINE - 2-3 VIEW; DG C-ARM 1-60 MIN

[Series 1: run · 2 of 2 slices shown]
[im 1/2]
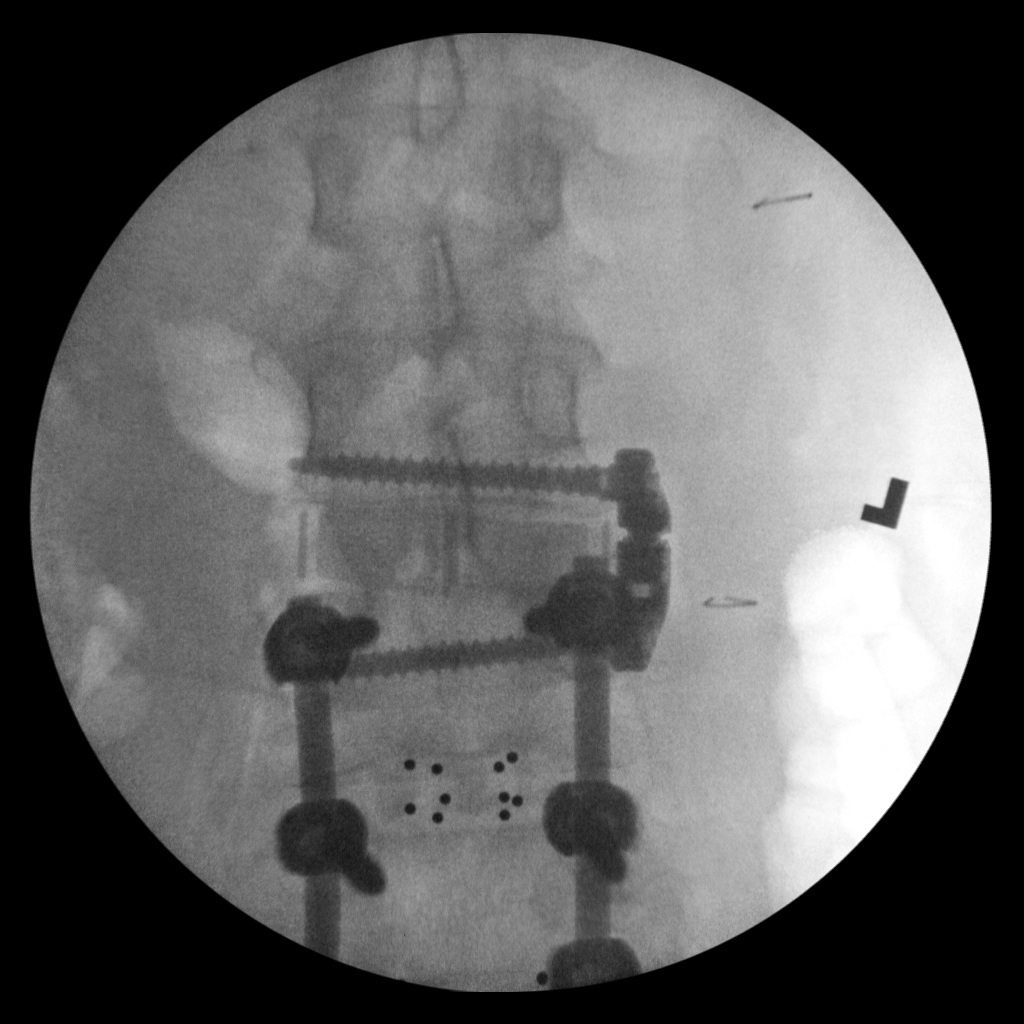
[im 2/2]
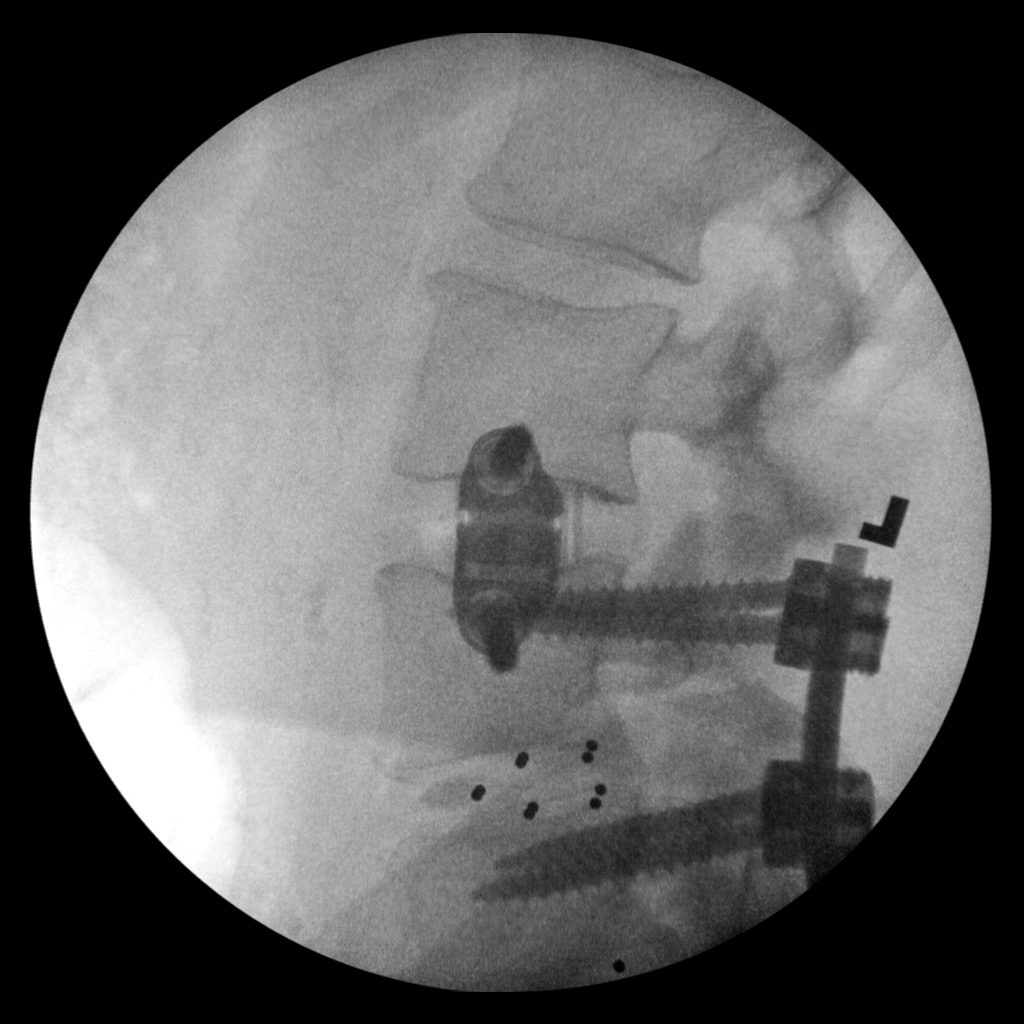

[2 of 2 positions shown; findings below may reference images not displayed]

FINDINGS: Two intraoperative images are submitted.

Lateral fixation hardware at the reported L3-L4 level. The L3
vertebral body screw is slightly proud to bone distally. The L4
screw tip is obscured by the superimposed posterior spinal fusion
construct.

Partially visualized posterior spinal fusion construct spanning
L5-S1 with interbody spacers at these levels.
IMPRESSION: Two intraoperative images of the lumbar spine as detailed.

## 2021-01-02 IMAGING — RF DG C-ARM 1-60 MIN
1 series · 2 of 2 positions shown · non-contrast
Comparison: MRI lumbar spine 02/21/2019

CLINICAL DATA: XLIF, left lumbar 3-4 anterolateral interbody fusion
with lateral plate RSTO.

EXAM:
LUMBAR SPINE - 2-3 VIEW; DG C-ARM 1-60 MIN

[Series 1: run · 2 of 2 slices shown]
[im 1/2]
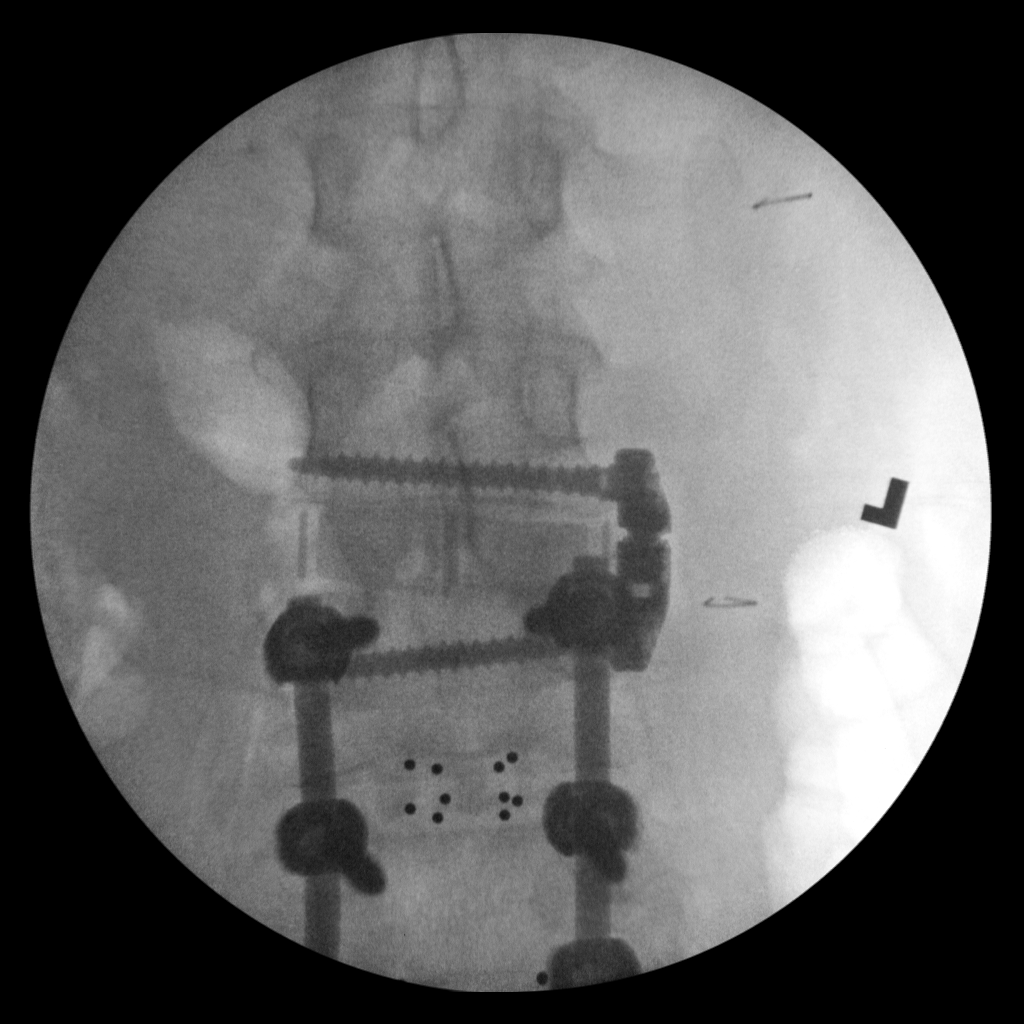
[im 2/2]
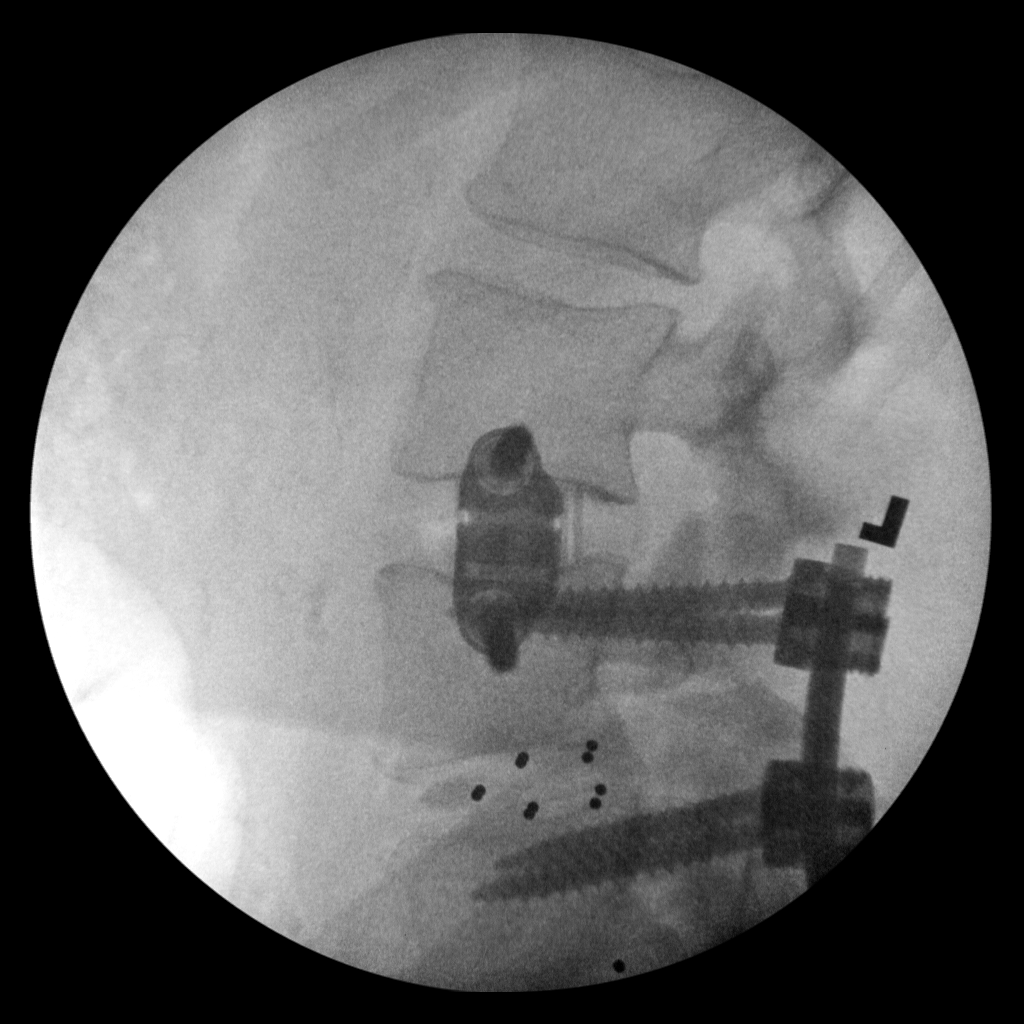

[2 of 2 positions shown; findings below may reference images not displayed]

FINDINGS: Two intraoperative images are submitted.

Lateral fixation hardware at the reported L3-L4 level. The L3
vertebral body screw is slightly proud to bone distally. The L4
screw tip is obscured by the superimposed posterior spinal fusion
construct.

Partially visualized posterior spinal fusion construct spanning
L5-S1 with interbody spacers at these levels.
IMPRESSION: Two intraoperative images of the lumbar spine as detailed.

## 2021-06-18 ENCOUNTER — Other Ambulatory Visit: Payer: Self-pay

## 2021-06-18 ENCOUNTER — Encounter: Payer: Self-pay | Admitting: Pulmonary Disease

## 2021-06-18 ENCOUNTER — Ambulatory Visit (INDEPENDENT_AMBULATORY_CARE_PROVIDER_SITE_OTHER): Payer: BC Managed Care – PPO | Admitting: Pulmonary Disease

## 2021-06-18 VITALS — BP 122/76 | HR 54 | Ht 76.0 in | Wt 272.6 lb

## 2021-06-18 DIAGNOSIS — J9383 Other pneumothorax: Secondary | ICD-10-CM | POA: Diagnosis not present

## 2021-06-18 DIAGNOSIS — R0609 Other forms of dyspnea: Secondary | ICD-10-CM

## 2021-06-18 NOTE — Patient Instructions (Addendum)
Nice to meet you  I am not really sure why you are not having a collapsed lung on the right.  I recommend we get a CT scan to further evaluate reasons your lungs may be at risk for this happening.  To help figure out some your shortness of breath, I recommend a pulmonary function test.  This gives [place] the most information on how well your lungs are working.  We will be in touch if we need to start an inhaler in the meantime before you come back for follow-up.  PFT next available based on patient schedule  Return to clinic in 3 months or sooner as needed with Dr. Judeth Horn

## 2021-06-18 NOTE — Progress Notes (Signed)
@Patient  ID: Brian Franco, male    DOB: [birth year], 66 y.o.   MRN: [id number]  Chief Complaint  Patient presents with   Consult    Referred for collapsed right lung. States this happened twice during bypass surgery. Denies any coughing but does have DOE. Denies any chest or back pain.     Referring provider: Gordy Clement, F*  HPI:   66 y.o. man whom we have seen in consultation for evaluation of spontaneous pneumothorax and dyspnea on exertion.  Note from referring provider reviewed.  Discharge summary [facility] fall 2022 reviewed.  Patient presented to emergency room with chest discomfort.  Troponins were elevated.  NSTEMI.  Treated medically.  Left heart cath revealed multivessel disease.  He went for CABG.  Left-sided chest tube and mediastinal drain was then placed.  Several days later on chest x-rays noted to have small right pneumothorax spontaneously.  Denies any procedures on that side.  No trauma.  Was in the hospital.  Repeat chest x-ray showed expansion.  A pigtail catheter was placed.  Did not adequately resolve pneumothorax so a second was placed.  Gradually over time these resolved over the course of 10 to 12 days.  He was in the hospital a total of 21 days.  Repeat chest x-ray at follow-up 10 days after discharge showed no pneumothorax on review of interpretation.  Overall he feels okay.  New dyspnea since surgery.  Worse on inclines or stairs.  No time of day when things are better or worse.  No position to make things better or worse.  No seasonal environmental factors he can apply to make things better or worse.  No alleviating or exacerbating factors.  He follows with local cardiologist in [place].  No concern noted.  No repeat TTE after surgery per his report.  Preoperative TTE and intraoperative TEE demonstrated normal EF and normal valvular function per review of records.  Reviewed CTA PE protocol during hospitalization the results of which just comment on volume  overload, no discussion of emphysema or other parenchymal findings that may predispose him to the spontaneous pneumothorax or pathology related to dyspnea or current symptoms.  He denies history of recurrent bronchitis.  No history of pneumonia.  No history of asthma.  Never used inhalers.  No issues with breathing prior to surgery.   PMH: CAD, hypertension Surgical history: Appendectomy, lumbar fusion, laminectomy, knee surgery, CABG Family history:History reviewed. No pertinent family history. Social history: Former smoker, 65-pack-year, quit November 2022, lives in [place], works for [employer], Theatre manager / Pulmonary Flowsheets:   ACT:  No flowsheet data found.  MMRC: No flowsheet data found.  Epworth:  No flowsheet data found.  Tests:   FENO:  No results found for: NITRICOXIDE  PFT: No flowsheet data found.  WALK:  No flowsheet data found.  Imaging: Personally reviewed  Lab Results: Personally reviewed CBC    Component Value Date/Time   WBC 19.9 (H) 11/28/2020 0632   RBC 4.33 11/28/2020 0632   HGB 13.4 11/28/2020 0632   HCT 40.2 11/28/2020 0632   PLT 233 11/28/2020 0632   MCV 92.8 11/28/2020 0632   MCH 30.9 11/28/2020 0632   MCHC 33.3 11/28/2020 0632   RDW 12.6 11/28/2020 0632    BMET    Component Value Date/Time   NA 136 11/28/2020 0632   K 3.9 11/28/2020 0632   CL 99 11/28/2020 0632   CO2 24 11/28/2020 0632   GLUCOSE 136 (H)  11/28/2020 0632   BUN 19 11/28/2020 0632   CREATININE 0.87 11/28/2020 0632   CALCIUM 9.1 11/28/2020 0632   GFRNONAA >60 11/28/2020 0632   GFRAA >60 03/30/2019 1300    BNP    Component Value Date/Time   BNP 62.4 11/27/2020 1834    ProBNP No results found for: PROBNP  Specialty Problems   None   No Known Allergies   There is no immunization history on file for this patient.  Past Medical History:  Diagnosis Date   History of kidney stones    Lumbar stenosis  with neurogenic claudication    Sleep apnea    Uses Cpap nightly    Tobacco History: Social History   Tobacco Use  Smoking Status Former   Packs/day: 1.50   Years: 43.00   Pack years: 64.50   Types: Cigarettes   Quit date: 03/23/2021   Years since quitting: 0.2  Smokeless Tobacco Never   Counseling given: Not Answered   Continue to not smoke  Outpatient Encounter Medications as of 06/18/2021  Medication Sig   amLODipine (NORVASC) 5 MG tablet Take 5 mg by mouth daily.   Barberry-Oreg Grape-Goldenseal (BERBERINE COMPLEX PO) Take 1 tablet by mouth daily.   citalopram (CELEXA) 40 MG tablet Take 40 mg by mouth daily.   cloNIDine (CATAPRES) 0.1 MG tablet Take 1 tablet (0.1 mg total) by mouth 2 (two) times daily.   clopidogrel (PLAVIX) 75 MG tablet Take 75 mg by mouth daily.   Coenzyme Q10 100 MG capsule Take 1 capsule by mouth daily.   fenofibrate 160 MG tablet Take 160 mg by mouth daily.   furosemide (LASIX) 20 MG tablet Take 20 mg by mouth daily.   ibuprofen (ADVIL) 800 MG tablet Take 800 mg by mouth every 6 (six) hours as needed for moderate pain.   LORazepam (ATIVAN) 0.5 MG tablet Take 0.5 mg by mouth 2 (two) times daily as needed.   metoprolol tartrate (LOPRESSOR) 25 MG tablet Take 25 mg by mouth daily.   oxyCODONE-acetaminophen (PERCOCET) 7.5-325 MG tablet Take 1 tablet by mouth 4 (four) times daily as needed (severe pain).   pregabalin (LYRICA) 50 MG capsule Take 1 capsule (50 mg total) by mouth 2 (two) times daily.   rosuvastatin (CRESTOR) 20 MG tablet Take 20 mg by mouth daily.   tamsulosin (FLOMAX) 0.4 MG CAPS capsule Take 0.4 mg by mouth daily.   [DISCONTINUED] methocarbamol (ROBAXIN) 500 MG tablet Take 1 tablet (500 mg total) by mouth every 6 (six) hours as needed for muscle spasms.   No facility-administered encounter medications on file as of 06/18/2021.     Review of Systems  Review of Systems  No chest pain with exertion.  No orthopnea or PND.  No inflammation.   No weight gain.  Comprehensive review of systems otherwise negative. Physical Exam  BP 122/76    Pulse (!) 54    Ht 6\' 4"  (1.93 m)    Wt 272 lb 9.6 oz (123.7 kg)    SpO2 98% Comment: on RA   BMI 33.18 kg/m   Wt Readings from Last 5 Encounters:  06/18/21 272 lb 9.6 oz (123.7 kg)  11/27/20 249 lb 12.5 oz (113.3 kg)  11/24/20 249 lb 12.8 oz (113.3 kg)  07/17/19 250 lb (113.4 kg)  07/13/19 256 lb 6.4 oz (116.3 kg)    BMI Readings from Last 5 Encounters:  06/18/21 33.18 kg/m  11/27/20 30.40 kg/m  11/24/20 30.41 kg/m  07/17/19 30.43 kg/m  07/13/19 31.21 kg/m  Physical Exam General: Sitting in exam chair, no acute distress Eyes: EOMI, icterus Neck: Supple no JVP Pulmonary: Clear, no work of breathing Cardiovascular: regular rhythm, no murmur Abdomen: Nondistended, bowel sounds present MSK: No synovitis, no joint effusion Neuro: Normal gait, no weakness Psych: Normal mood, full affect   Assessment & Plan:   Dyspnea on exertion: Likely deconditioning in the setting of 3-week hospitalization after CABG fall 2022.  Will obtain PFTs for further evaluation.  At risk for smoking-related lung disease given 65-pack-year history.  Spontaneous pneumothorax: Developed while in hospital, contralateral side of initial placement of chest tubes.  No trauma or procedures to explain.  Suspect would be secondary in the setting of emphysema although I have no imaging to confirm this.  CT chest ordered for further evaluation of lung parenchyma that would put him at risk for development of pneumothorax.   Return in about 3 months (around 09/15/2021).   Karren Burly, MD 06/19/2021

## 2021-06-30 ENCOUNTER — Other Ambulatory Visit: Payer: BC Managed Care – PPO

## 2022-08-29 IMAGING — DX DG CHEST 1V PORT
1 series · 2 of 2 positions shown · non-contrast
Comparison: 11/27/2020

CLINICAL DATA: Dyspnea

EXAM:
PORTABLE CHEST 1 VIEW

[Series 1: chest · 0.14mm/px · 2 of 2 slices shown]
[im 1/2]
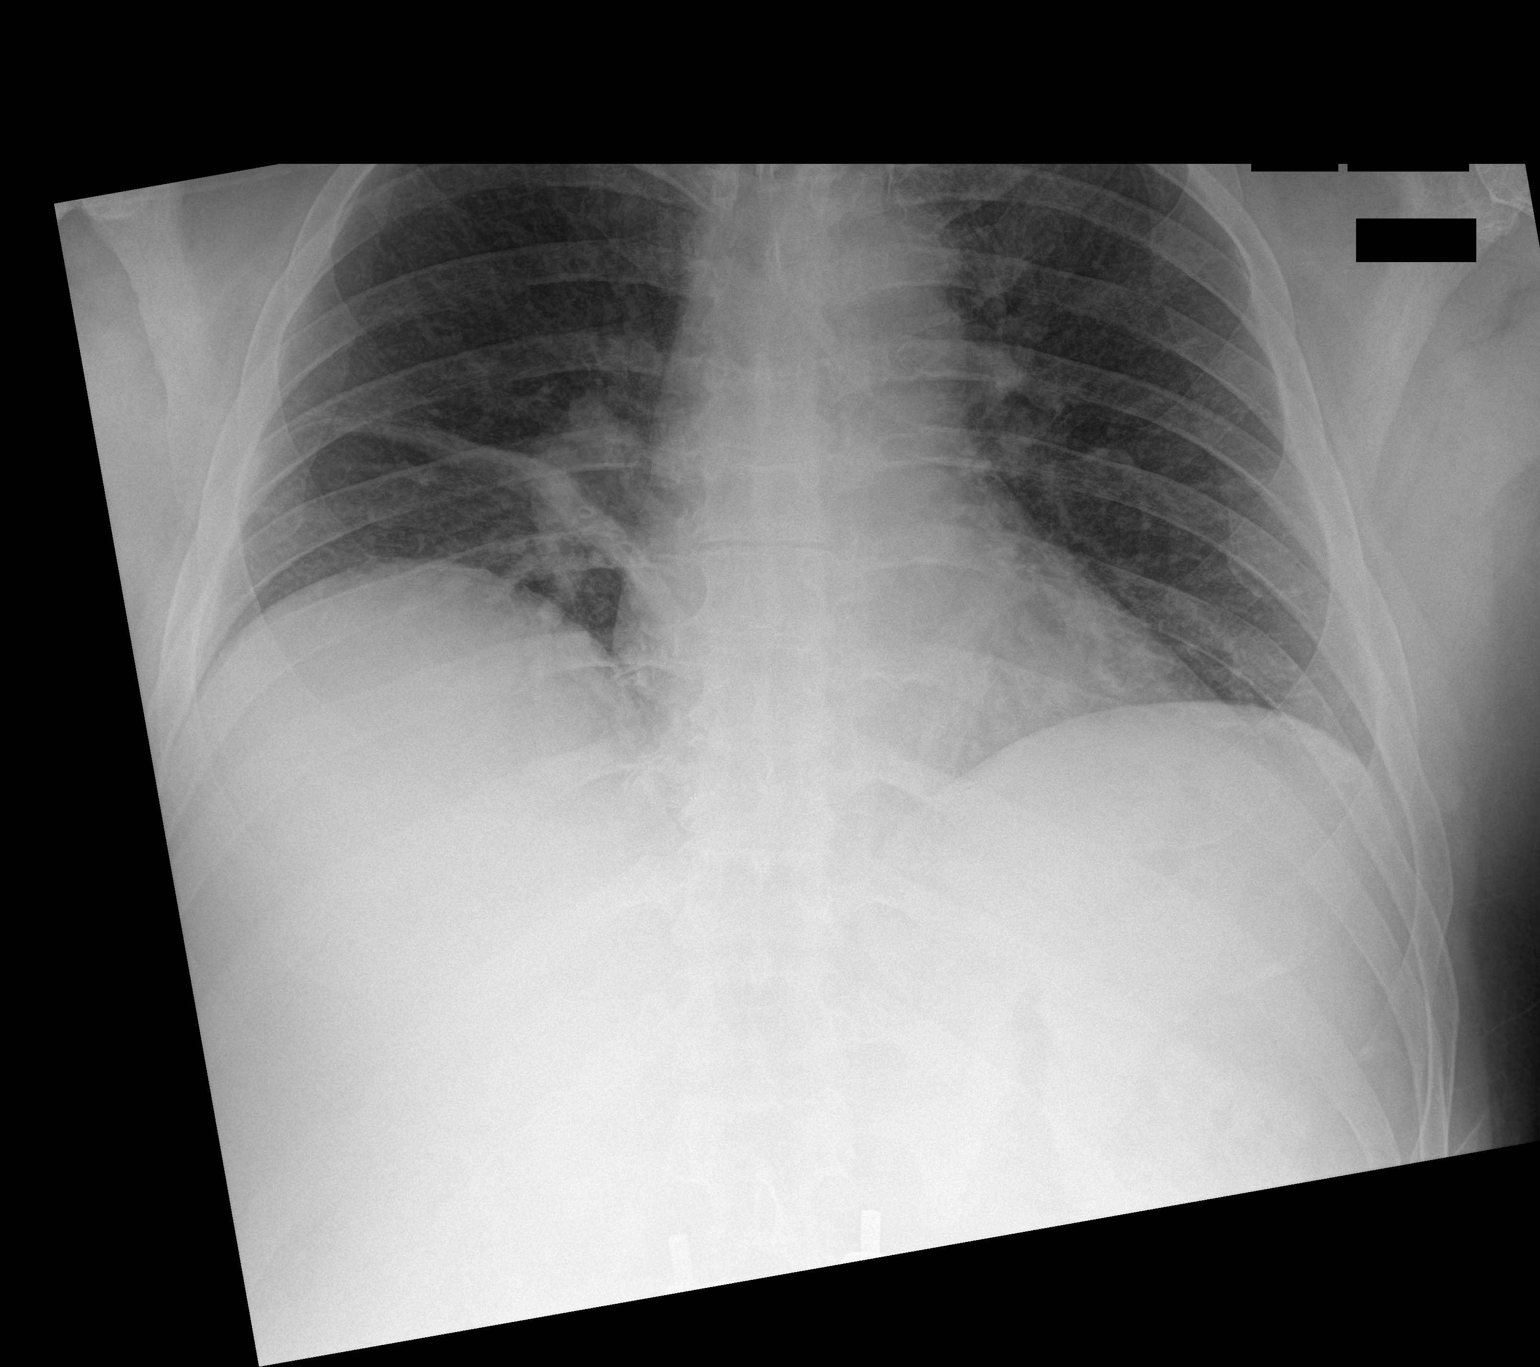
[im 2/2]
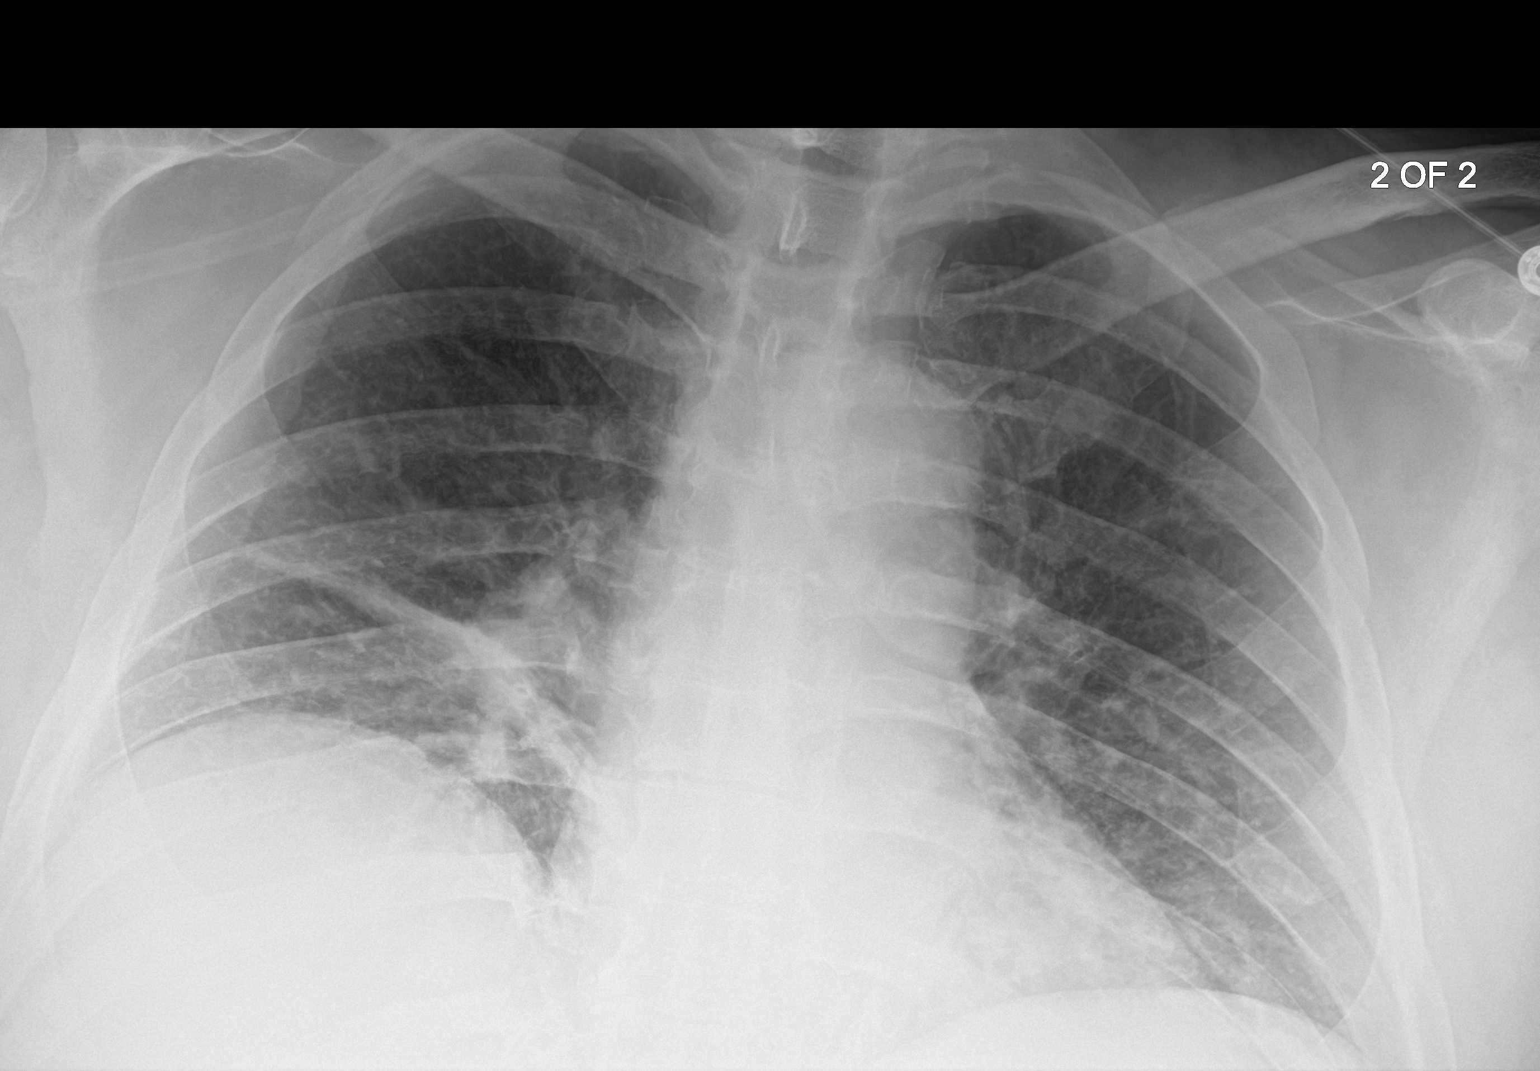

[2 of 2 positions shown; findings below may reference images not displayed]

FINDINGS: Discoid atelectasis at the right lung base again noted. Lung volumes
are small. Mild left basilar atelectasis. No pneumothorax or pleural
effusion. Cardiac size within normal limits. No acute bone
abnormality.
IMPRESSION: Stable pulmonary hypoinflation with bibasilar atelectasis.

## 2023-04-12 NOTE — Progress Notes (Signed)
Anesthesia Review:  PCP: Cardiologist : Chest x-ray : EKG : Echo : Stress test: Cardiac Cath :  Activity level:  Sleep Study/ CPAP : Fasting Blood Sugar :      / Checks Blood Sugar -- times a day:   Blood Thinner/ Instructions /Last Dose: ASA / Instructions/ Last Dose :  

## 2023-04-12 NOTE — Patient Instructions (Signed)
SURGICAL WAITING ROOM VISITATION  Patients having surgery or a procedure may have no more than 2 support people in the waiting area - these visitors may rotate.    Children under the age of 32 must have an adult with them who is not the patient.  Due to an increase in RSV and influenza rates and associated hospitalizations, children ages 21 and under may not visit patients in [facility] hospitals.  If the patient needs to stay at the hospital during part of their recovery, the visitor guidelines for inpatient rooms apply. Pre-op nurse will coordinate an appropriate time for 1 support person to accompany patient in pre-op.  This support person may not rotate.    Please refer to the [facility] website for the visitor guidelines for Inpatients (after your surgery is over and you are in a regular room).       Your procedure is scheduled on:  04/26/2023    Report to [facility] Main Entrance    Report to admitting at  0730 AM   Call this number if you have problems the morning of surgery [phone]   Do not eat food :After Midnight.   After Midnight you may have the following liquids until _ 0700_____ AM  DAY OF SURGERY  Water Non-Citrus Juices (without pulp, NO RED-Apple, White grape, White cranberry) Black Coffee (NO MILK/CREAM OR CREAMERS, sugar ok)  Clear Tea (NO MILK/CREAM OR CREAMERS, sugar ok) regular and decaf                             Plain Jell-O (NO RED)                                           Fruit ices (not with fruit pulp, NO RED)                                     Popsicles (NO RED)                                                               Sports drinks like Gatorade (NO RED)                    The day of surgery:  Drink ONE (1) Pre-Surgery Clear Ensure or G2 at  0700AM ( have completed by )  the morning of surgery. Drink in one sitting. Do not sip.  This drink was given to you during your hospital  pre-op appointment visit. Nothing else to  drink after completing the  Pre-Surgery Clear Ensure or G2.          If you have questions, please contact your surgeon's office.       Oral Hygiene is also important to reduce your risk of infection.                                    Remember - BRUSH YOUR TEETH THE MORNING OF SURGERY WITH  YOUR REGULAR TOOTHPASTE  DENTURES WILL BE REMOVED PRIOR TO SURGERY PLEASE DO NOT APPLY "Poly grip" OR ADHESIVES!!!   Do NOT smoke after Midnight   Stop all vitamins and herbal supplements 7 days before surgery.   Take these medicines the morning of surgery with A SIP OF WATER: amlodipine, celexa, clonidine, metoprolol, lyrica, flomax   DO NOT TAKE ANY ORAL DIABETIC MEDICATIONS DAY OF YOUR SURGERY  Bring CPAP mask and tubing day of surgery.                              You may not have any metal on your body including hair pins, jewelry, and body piercing             Do not wear make-up, lotions, powders, perfumes/cologne, or deodorant  Do not wear nail polish including gel and S&S, artificial/acrylic nails, or any other type of covering on natural nails including finger and toenails. If you have artificial nails, gel coating, etc. that needs to be removed by a nail salon please have this removed prior to surgery or surgery may need to be canceled/ delayed if the surgeon/ anesthesia feels like they are unable to be safely monitored.   Do not shave  48 hours prior to surgery.               Men may shave face and neck.   Do not bring valuables to the hospital. [facility] IS NOT             RESPONSIBLE   FOR VALUABLES.   Contacts, glasses, dentures or bridgework may not be worn into surgery.   Bring small overnight bag day of surgery.   DO NOT BRING YOUR HOME MEDICATIONS TO THE HOSPITAL. PHARMACY WILL DISPENSE MEDICATIONS LISTED ON YOUR MEDICATION LIST TO YOU DURING YOUR ADMISSION IN THE HOSPITAL!    Patients discharged on the day of surgery will not be allowed to drive home.  Someone NEEDS  to stay with you for the first 24 hours after anesthesia.   Special Instructions: Bring a copy of your healthcare power of attorney and living will documents the day of surgery if you haven't scanned them before.              Please read over the following fact sheets you were given: IF YOU HAVE QUESTIONS ABOUT YOUR PRE-OP INSTRUCTIONS PLEASE CALL [phone]   If you received a COVID test during your pre-op visit  it is requested that you wear a mask when out in public, stay away from anyone that may not be feeling well and notify your surgeon if you develop symptoms. If you test positive for Covid or have been in contact with anyone that has tested positive in the last 10 days please notify you surgeon.      Pre-operative 5 CHG Bath Instructions   You can play a key role in reducing the risk of infection after surgery. Your skin needs to be as free of germs as possible. You can reduce the number of germs on your skin by washing with CHG (chlorhexidine gluconate) soap before surgery. CHG is an antiseptic soap that kills germs and continues to kill germs even after washing.   DO NOT use if you have an allergy to chlorhexidine/CHG or antibacterial soaps. If your skin becomes reddened or irritated, stop using the CHG and notify one of our RNs at [phone].   Please shower with  the CHG soap starting 4 days before surgery using the following schedule:     Please keep in mind the following:  DO NOT shave, including legs and underarms, starting the day of your first shower.   You may shave your face at any point before/day of surgery.  Place clean sheets on your bed the day you start using CHG soap. Use a clean washcloth (not used since being washed) for each shower. DO NOT sleep with pets once you start using the CHG.   CHG Shower Instructions:  If you choose to wash your hair and private area, wash first with your normal shampoo/soap.  After you use shampoo/soap, rinse your hair and  body thoroughly to remove shampoo/soap residue.  Turn the water OFF and apply about 3 tablespoons (45 ml) of CHG soap to a CLEAN washcloth.  Apply CHG soap ONLY FROM YOUR NECK DOWN TO YOUR TOES (washing for 3-5 minutes)  DO NOT use CHG soap on face, private areas, open wounds, or sores.  Pay special attention to the area where your surgery is being performed.  If you are having back surgery, having someone wash your back for you may be helpful. Wait 2 minutes after CHG soap is applied, then you may rinse off the CHG soap.  Pat dry with a clean towel  Put on clean clothes/pajamas   If you choose to wear lotion, please use ONLY the CHG-compatible lotions on the back of this paper.     Additional instructions for the day of surgery: DO NOT APPLY any lotions, deodorants, cologne, or perfumes.   Put on clean/comfortable clothes.  Brush your teeth.  Ask your nurse before applying any prescription medications to the skin.      CHG Compatible Lotions   Aveeno Moisturizing lotion  Cetaphil Moisturizing Cream  Cetaphil Moisturizing Lotion  Clairol Herbal Essence Moisturizing Lotion, Dry Skin  Clairol Herbal Essence Moisturizing Lotion, Extra Dry Skin  Clairol Herbal Essence Moisturizing Lotion, Normal Skin  Curel Age Defying Therapeutic Moisturizing Lotion with Alpha Hydroxy  Curel Extreme Care Body Lotion  Curel Soothing Hands Moisturizing Hand Lotion  Curel Therapeutic Moisturizing Cream, Fragrance-Free  Curel Therapeutic Moisturizing Lotion, Fragrance-Free  Curel Therapeutic Moisturizing Lotion, Original Formula  Eucerin Daily Replenishing Lotion  Eucerin Dry Skin Therapy Plus Alpha Hydroxy Crme  Eucerin Dry Skin Therapy Plus Alpha Hydroxy Lotion  Eucerin Original Crme  Eucerin Original Lotion  Eucerin Plus Crme Eucerin Plus Lotion  Eucerin TriLipid Replenishing Lotion  Keri Anti-Bacterial Hand Lotion  Keri Deep Conditioning Original Lotion Dry Skin Formula Softly Scented   Keri Deep Conditioning Original Lotion, Fragrance Free Sensitive Skin Formula  Keri Lotion Fast Absorbing Fragrance Free Sensitive Skin Formula  Keri Lotion Fast Absorbing Softly Scented Dry Skin Formula  Keri Original Lotion  Keri Skin Renewal Lotion Keri Silky Smooth Lotion  Keri Silky Smooth Sensitive Skin Lotion  Nivea Body Creamy Conditioning Oil  Nivea Body Extra Enriched Teacher, adult education Moisturizing Lotion Nivea Crme  Nivea Skin Firming Lotion  NutraDerm 30 Skin Lotion  NutraDerm Skin Lotion  NutraDerm Therapeutic Skin Cream  NutraDerm Therapeutic Skin Lotion  ProShield Protective Hand Cream  Provon moisturizing lotion

## 2023-04-15 ENCOUNTER — Encounter (HOSPITAL_COMMUNITY)
Admission: RE | Admit: 2023-04-15 | Discharge: 2023-04-15 | Disposition: A | Discharge status: Home / Self Care | Payer: Medicare HMO | Source: Ambulatory Visit

## 2023-04-19 ENCOUNTER — Encounter (HOSPITAL_COMMUNITY): Payer: Self-pay

## 2023-04-19 NOTE — Patient Instructions (Addendum)
SURGICAL WAITING ROOM VISITATION Patients having surgery or a procedure may have no more than 2 support people in the waiting area - these visitors may rotate.    Children under the age of 73 must have an adult with them who is not the patient.  If the patient needs to stay at the hospital during part of their recovery, the visitor guidelines for inpatient rooms apply. Pre-op nurse will coordinate an appropriate time for 1 support person to accompany patient in pre-op.  This support person may not rotate.    Please refer to the [facility] website for the visitor guidelines for Inpatients (after your surgery is over and you are in a regular room).       Your procedure is scheduled on: 04-26-23   Report to [facility] Main Entrance    Report to admitting at 7:30 AM   Call this number if you have problems the morning of surgery [phone]   Do not eat food :After Midnight.   After Midnight you may have the following liquids until 7:00 AM DAY OF SURGERY  Water Non-Citrus Juices (without pulp, NO RED-Apple, White grape, White cranberry) Black Coffee (NO MILK/CREAM OR CREAMERS, sugar ok)  Clear Tea (NO MILK/CREAM OR CREAMERS, sugar ok) regular and decaf                             Plain Jell-O (NO RED)                                           Fruit ices (not with fruit pulp, NO RED)                                     Popsicles (NO RED)                                                               Sports drinks like Gatorade (NO RED)                   The day of surgery:  Drink ONE (1) Pre-Surgery Clear Ensure by 7:00 AM the morning of surgery. Drink in one sitting. Do not sip.  This drink was given to you during your hospital  pre-op appointment visit. Nothing else to drink after completing the Pre-Surgery Clear Ensure.          If you have questions, please contact your surgeon's office.   FOLLOW  ANY ADDITIONAL PRE OP INSTRUCTIONS YOU RECEIVED FROM YOUR SURGEON'S  OFFICE!!!     Oral Hygiene is also important to reduce your risk of infection.                                    Remember - BRUSH YOUR TEETH THE MORNING OF SURGERY WITH YOUR REGULAR TOOTHPASTE   Do NOT smoke after Midnight   Take these medicines the morning of surgery with A SIP OF WATER:   Amlodipine  Citalopram  Ezetimibe  Metoprolol  Tamsulosin  Hydrocodone if needed  Stop all vitamins and herbal supplements 7 days before surgery  Bring CPAP mask and tubing day of surgery.                              You may not have any metal on your body including  jewelry, and body piercing              Men may shave face and neck.   Do not bring valuables to the hospital. [facility] IS NOT RESPONSIBLE   FOR VALUABLES.   Contacts, dentures or bridgework may not be worn into surgery.   Bring small overnight bag day of surgery.   DO NOT BRING YOUR HOME MEDICATIONS TO THE HOSPITAL. PHARMACY WILL DISPENSE MEDICATIONS LISTED ON YOUR MEDICATION LIST TO YOU DURING YOUR ADMISSION IN THE HOSPITAL!     Special Instructions: Bring a copy of your healthcare power of attorney and living will documents the day of surgery if you haven't scanned them before.              Please read over the following fact sheets you were given: IF YOU HAVE QUESTIONS ABOUT YOUR PRE-OP INSTRUCTIONS PLEASE CALL [phone] [name]  If you received a COVID test during your pre-op visit  it is requested that you wear a mask when out in public, stay away from anyone that may not be feeling well and notify your surgeon if you develop symptoms. If you test positive for Covid or have been in contact with anyone that has tested positive in the last 10 days please notify you surgeon.    Pre-operative 5 CHG Bath Instructions   You can play a key role in reducing the risk of infection after surgery. Your skin needs to be as free of germs as possible. You can reduce the number of germs on your skin by washing with CHG  (chlorhexidine gluconate) soap before surgery. CHG is an antiseptic soap that kills germs and continues to kill germs even after washing.   DO NOT use if you have an allergy to chlorhexidine/CHG or antibacterial soaps. If your skin becomes reddened or irritated, stop using the CHG and notify one of our RNs at [phone].   Please shower with the CHG soap starting 4 days before surgery using the following schedule:     Please keep in mind the following:  DO NOT shave, including legs and underarms, starting the day of your first shower.   You may shave your face at any point before/day of surgery.  Place clean sheets on your bed the day you start using CHG soap. Use a clean washcloth (not used since being washed) for each shower. DO NOT sleep with pets once you start using the CHG.   CHG Shower Instructions:  If you choose to wash your hair and private area, wash first with your normal shampoo/soap.  After you use shampoo/soap, rinse your hair and body thoroughly to remove shampoo/soap residue.  Turn the water OFF and apply about 3 tablespoons (45 ml) of CHG soap to a CLEAN washcloth.  Apply CHG soap ONLY FROM YOUR NECK DOWN TO YOUR TOES (washing for 3-5 minutes)  DO NOT use CHG soap on face, private areas, open wounds, or sores.  Pay special attention to the area where your surgery is being performed.  If you are having back surgery, having someone wash your back for you may  be helpful. Wait 2 minutes after CHG soap is applied, then you may rinse off the CHG soap.  Pat dry with a clean towel  Put on clean clothes/pajamas   If you choose to wear lotion, please use ONLY the CHG-compatible lotions on the back of this paper.     Additional instructions for the day of surgery: DO NOT APPLY any lotions, deodorants, cologne, or perfumes.   Put on clean/comfortable clothes.  Brush your teeth.  Ask your nurse before applying any prescription medications to the skin.      CHG Compatible  Lotions   Aveeno Moisturizing lotion  Cetaphil Moisturizing Cream  Cetaphil Moisturizing Lotion  Clairol Herbal Essence Moisturizing Lotion, Dry Skin  Clairol Herbal Essence Moisturizing Lotion, Extra Dry Skin  Clairol Herbal Essence Moisturizing Lotion, Normal Skin  Curel Age Defying Therapeutic Moisturizing Lotion with Alpha Hydroxy  Curel Extreme Care Body Lotion  Curel Soothing Hands Moisturizing Hand Lotion  Curel Therapeutic Moisturizing Cream, Fragrance-Free  Curel Therapeutic Moisturizing Lotion, Fragrance-Free  Curel Therapeutic Moisturizing Lotion, Original Formula  Eucerin Daily Replenishing Lotion  Eucerin Dry Skin Therapy Plus Alpha Hydroxy Crme  Eucerin Dry Skin Therapy Plus Alpha Hydroxy Lotion  Eucerin Original Crme  Eucerin Original Lotion  Eucerin Plus Crme Eucerin Plus Lotion  Eucerin TriLipid Replenishing Lotion  Keri Anti-Bacterial Hand Lotion  Keri Deep Conditioning Original Lotion Dry Skin Formula Softly Scented  Keri Deep Conditioning Original Lotion, Fragrance Free Sensitive Skin Formula  Keri Lotion Fast Absorbing Fragrance Free Sensitive Skin Formula  Keri Lotion Fast Absorbing Softly Scented Dry Skin Formula  Keri Original Lotion  Keri Skin Renewal Lotion Keri Silky Smooth Lotion  Keri Silky Smooth Sensitive Skin Lotion  Nivea Body Creamy Conditioning Oil  Nivea Body Extra Enriched Lotion  Nivea Body Original Lotion  Nivea Body Sheer Moisturizing Lotion Nivea Crme  Nivea Skin Firming Lotion  NutraDerm 30 Skin Lotion  NutraDerm Skin Lotion  NutraDerm Therapeutic Skin Cream  NutraDerm Therapeutic Skin Lotion  ProShield Protective Hand Cream  Provon moisturizing lotion   PATIENT SIGNATURE_________________________________  NURSE SIGNATURE__________________________________  ________________________________________________________________________    Brian Franco  An incentive spirometer is a tool that can help keep your lungs clear  and active. This tool measures how well you are filling your lungs with each breath. Taking long deep breaths may help reverse or decrease the chance of developing breathing (pulmonary) problems (especially infection) following: A long period of time when you are unable to move or be active. BEFORE THE PROCEDURE  If the spirometer includes an indicator to show your best effort, your nurse or respiratory therapist will set it to a desired goal. If possible, sit up straight or lean slightly forward. Try not to slouch. Hold the incentive spirometer in an upright position. INSTRUCTIONS FOR USE  Sit on the edge of your bed if possible, or sit up as far as you can in bed or on a chair. Hold the incentive spirometer in an upright position. Breathe out normally. Place the mouthpiece in your mouth and seal your lips tightly around it. Breathe in slowly and as deeply as possible, raising the piston or the ball toward the top of the column. Hold your breath for 3-5 seconds or for as long as possible. Allow the piston or ball to fall to the bottom of the column. Remove the mouthpiece from your mouth and breathe out normally. Rest for a few seconds and repeat Steps 1 through 7 at least 10 times every 1-2 hours when you  are awake. Take your time and take a few normal breaths between deep breaths. The spirometer may include an indicator to show your best effort. Use the indicator as a goal to work toward during each repetition. After each set of 10 deep breaths, practice coughing to be sure your lungs are clear. If you have an incision (the cut made at the time of surgery), support your incision when coughing by placing a pillow or rolled up towels firmly against it. Once you are able to get out of bed, walk around indoors and cough well. You may stop using the incentive spirometer when instructed by your caregiver.  RISKS AND COMPLICATIONS Take your time so you do not get dizzy or light-headed. If you are in  pain, you may need to take or ask for pain medication before doing incentive spirometry. It is harder to take a deep breath if you are having pain. AFTER USE Rest and breathe slowly and easily. It can be helpful to keep track of a log of your progress. Your caregiver can provide you with a simple table to help with this. If you are using the spirometer at home, follow these instructions: SEEK MEDICAL CARE IF:  You are having difficultly using the spirometer. You have trouble using the spirometer as often as instructed. Your pain medication is not giving enough relief while using the spirometer. You develop fever of 100.5 F (38.1 C) or higher. SEEK IMMEDIATE MEDICAL CARE IF:  You cough up bloody sputum that had not been present before. You develop fever of 102 F (38.9 C) or greater. You develop worsening pain at or near the incision site. MAKE SURE YOU:  Understand these instructions. Will watch your condition. Will get help right away if you are not doing well or get worse. Document Released: 09/06/2006 Document Revised: 07/19/2011 Document Reviewed: 11/07/2006 [facility] Patient Information 2014 [place], [place].   ________________________________________________________________________

## 2023-04-19 NOTE — Progress Notes (Signed)
COVID Vaccine Completed:  Date of COVID positive in last 90 days:  PCP - Marisue Ivan, MD Cardiologist -  Pulmonologist - Vilma Meckel, MD  Chest x-ray -  EKG - 04-06-23 CEW Stress Test -  ECHO -  Cardiac Cath -  Pacemaker/ICD device last checked: Spinal Cord Stimulator:  Bowel Prep -   Sleep Study - Yes, +sleep apnea CPAP - Yes  Fasting Blood Sugar -  Checks Blood Sugar _____ times a day  Last dose of GLP1 agonist-  N/A GLP1 instructions:  Hold 7 days before surgery    Last dose of SGLT-2 inhibitors-  N/A SGLT-2 instructions:  Hold 3 days before surgery    Blood Thinner Instructions:  Time Aspirin Instructions: Last Dose:  Activity level:  Can go up a flight of stairs and perform activities of daily living without stopping and without symptoms of chest pain or shortness of breath.  Able to exercise without symptoms  Unable to go up a flight of stairs without symptoms of     Anesthesia review:  CAD with hx of CABG, HTN, OSA  Patient denies shortness of breath, fever, cough and chest pain at PAT appointment  Patient verbalized understanding of instructions that were given to them at the PAT appointment. Patient was also instructed that they will need to review over the PAT instructions again at home before surgery.

## 2023-04-21 ENCOUNTER — Other Ambulatory Visit: Payer: Self-pay

## 2023-04-21 ENCOUNTER — Encounter (HOSPITAL_COMMUNITY)
Admission: RE | Admit: 2023-04-21 | Discharge: 2023-04-21 | Disposition: A | Discharge status: Home / Self Care | Payer: Medicare HMO | Source: Ambulatory Visit | Attending: Orthopedic Surgery | Admitting: Orthopedic Surgery

## 2023-04-21 ENCOUNTER — Encounter (HOSPITAL_COMMUNITY): Payer: Self-pay

## 2023-04-21 VITALS — Ht 76.0 in | Wt 264.0 lb

## 2023-04-21 DIAGNOSIS — I2510 Atherosclerotic heart disease of native coronary artery without angina pectoris: Secondary | ICD-10-CM | POA: Diagnosis not present

## 2023-04-21 DIAGNOSIS — M1711 Unilateral primary osteoarthritis, right knee: Secondary | ICD-10-CM | POA: Diagnosis not present

## 2023-04-21 DIAGNOSIS — Z01812 Encounter for preprocedural laboratory examination: Secondary | ICD-10-CM | POA: Diagnosis present

## 2023-04-21 DIAGNOSIS — G4730 Sleep apnea, unspecified: Secondary | ICD-10-CM | POA: Insufficient documentation

## 2023-04-21 DIAGNOSIS — F17200 Nicotine dependence, unspecified, uncomplicated: Secondary | ICD-10-CM | POA: Insufficient documentation

## 2023-04-21 DIAGNOSIS — I10 Essential (primary) hypertension: Secondary | ICD-10-CM | POA: Diagnosis not present

## 2023-04-21 DIAGNOSIS — Z951 Presence of aortocoronary bypass graft: Secondary | ICD-10-CM | POA: Insufficient documentation

## 2023-04-21 DIAGNOSIS — Z01818 Encounter for other preprocedural examination: Secondary | ICD-10-CM

## 2023-04-21 HISTORY — DX: Atherosclerotic heart disease of native coronary artery without angina pectoris: I25.10

## 2023-04-21 HISTORY — DX: Essential (primary) hypertension: I10

## 2023-04-21 HISTORY — DX: Unspecified osteoarthritis, unspecified site: M19.90

## 2023-04-21 LAB — CBC
HCT: 47.2 % (ref 39.0–52.0)
Hemoglobin: 16 g/dL (ref 13.0–17.0)
MCH: 31.4 pg (ref 26.0–34.0)
MCHC: 33.9 g/dL (ref 30.0–36.0)
MCV: 92.7 fL (ref 80.0–100.0)
Platelets: 224 10*3/uL (ref 150–400)
RBC: 5.09 MIL/uL (ref 4.22–5.81)
RDW: 12.5 % (ref 11.5–15.5)
WBC: 9.8 10*3/uL (ref 4.0–10.5)
nRBC: 0 % (ref 0.0–0.2)

## 2023-04-21 LAB — BASIC METABOLIC PANEL
Anion gap: 9 (ref 5–15)
BUN: 23 mg/dL (ref 8–23)
CO2: 24 mmol/L (ref 22–32)
Calcium: 9 mg/dL (ref 8.9–10.3)
Chloride: 106 mmol/L (ref 98–111)
Creatinine, Ser: 0.83 mg/dL (ref 0.61–1.24)
GFR, Estimated: >60 mL/min (ref >60)
Glucose, Bld: 144 mg/dL — ABNORMAL HIGH (ref 70–99)
Potassium: 3.9 mmol/L (ref 3.5–5.1)
Sodium: 139 mmol/L (ref 135–145)

## 2023-04-21 LAB — SURGICAL PCR SCREEN
MRSA, PCR: NEGATIVE
Staphylococcus aureus: NEGATIVE

## 2023-04-22 NOTE — Progress Notes (Signed)
Anesthesia Chart Review   Case: [id number] Date/Time: 04/26/23 0950   Procedure: TOTAL KNEE ARTHROPLASTY (Right: Knee)   Anesthesia type: Spinal   Pre-op diagnosis: Right knee osteoarthritis   Location: [facility] 09 / [facility]   Surgeons: Durene Romans, MD       DISCUSSION:67 y.o. smoker with h/o HTN, sleep apnea, CAD s/p CABG 2022, PAF,  right knee OA scheduled for above procedure 04/26/2023 with Dr. Durene Romans.   Pt seen by cardiology for preoperative evaluation 04/18/2023. Per OV note, "patient status post 4 vessel bypass in 2022 with resolution of exertional symptoms. He is due for right total knee replacement surgery. Beta-blocker should be continued perioperatively. He would be considered a moderate cardiovascular risk for general anesthesia.  Will attempt to obtain echo prior to surgery."  Echo 04/20/2023 with EF 60-65%, mild aortic valve sclerosis without stenosis.   VS: Ht 6\' 4"  (1.93 m)   Wt 119.7 kg   BMI 32.14 kg/m   PROVIDERS: Marisue Ivan, MD is PCP    LABS: Labs reviewed: Acceptable for surgery. (all labs ordered are listed, but only abnormal results are displayed)  Labs Reviewed  BASIC METABOLIC PANEL - Abnormal; Notable for the following components:      Result Value   Glucose, Bld 144 (*)    All other components within normal limits  SURGICAL PCR SCREEN  CBC     IMAGES:   EKG:   CV: Echo 04/20/2023 Normal biventricular dimensions and systolic function  Aortic valve sclerosis Past Medical History:  Diagnosis Date   Arthritis    Coronary artery disease    History of kidney stones    Hypertension    Lumbar stenosis with neurogenic claudication    Sleep apnea    Uses Cpap nightly    Past Surgical History:  Procedure Laterality Date   ANTERIOR LAT LUMBAR FUSION Left 04/03/2019   Procedure: Left Lumbar Three-Four Anterolateral interbody fusion with lateral plate;  Surgeon: Maeola Harman, MD;  Location: [facility] OR;  Service: Neurosurgery;   Laterality: Left;  Left Lumbar Three-Four Anterolateral interbody fusion with lateral plate   APPENDECTOMY     BACK SURGERY     Laminectomy L5-S1 - x 3   CARPAL TUNNEL RELEASE Right 07/17/2019   Procedure: Right carpal tunnel release;  Surgeon: Maeola Harman, MD;  Location: [facility] OR;  Service: Neurosurgery;  Laterality: Right;   CARPAL TUNNEL RELEASE Left    COLONOSCOPY     CORONARY ARTERY BYPASS GRAFT     KNEE ARTHROSCOPY     Right   TRANSFORAMINAL LUMBAR INTERBODY FUSION (TLIF) WITH PEDICLE SCREW FIXATION 1 LEVEL Left 11/27/2020   Procedure: Left Lumbar Two-Three Transforaminal lumbar interbody fusion with exploration of adjacent level fixation;  Surgeon: Maeola Harman, MD;  Location: [facility] OR;  Service: Neurosurgery;  Laterality: Left;  Left Lumbar Two-Three Transforaminal lumbar interbody fusion with exploration of adjacent level fixation   WISDOM TOOTH EXTRACTION      MEDICATIONS:  amLODipine (NORVASC) 5 MG tablet   citalopram (CELEXA) 40 MG tablet   ezetimibe (ZETIA) 10 MG tablet   furosemide (LASIX) 20 MG tablet   HYDROcodone-acetaminophen (NORCO) 10-325 MG tablet   ibuprofen (ADVIL) 800 MG tablet   iron polysaccharides (NIFEREX) 150 MG capsule   metoprolol succinate (TOPROL-XL) 25 MG 24 hr tablet   REPATHA SURECLICK 140 MG/ML SOAJ   tamsulosin (FLOMAX) 0.4 MG CAPS capsule   No current facility-administered medications for this encounter.      Jodell Cipro Ward, PA-C  [facility] Pre-Surgical Testing [phone]

## 2023-04-25 NOTE — Anesthesia Preprocedure Evaluation (Addendum)
Anesthesia Evaluation  Patient identified by MRN, date of birth, ID band Patient awake    Reviewed: Allergy & Precautions, NPO status , Patient's Chart, lab work & pertinent test results, reviewed documented beta blocker date and time   Airway Mallampati: I  TM Distance: >3 FB Neck ROM: Full    Dental no notable dental hx.    Pulmonary sleep apnea and Continuous Positive Airway Pressure Ventilation , Current Smoker and Patient abstained from smoking.   Pulmonary exam normal breath sounds clear to auscultation       Cardiovascular hypertension, Pt. on medications and Pt. on home beta blockers + CAD  Normal cardiovascular exam Rhythm:Regular Rate:Normal     Neuro/Psych negative neurological ROS  negative psych ROS   GI/Hepatic negative GI ROS, Neg liver ROS,,,  Endo/Other  negative endocrine ROS    Renal/GU negative Renal ROS  negative genitourinary   Musculoskeletal negative musculoskeletal ROS (+)  5 prior back surgeries with significant hardware in the lumbar region   Abdominal   Peds  Hematology negative hematology ROS (+)   Anesthesia Other Findings   Reproductive/Obstetrics                             Anesthesia Physical Anesthesia Plan  ASA: 3  Anesthesia Plan: Regional and General   Post-op Pain Management: Regional block* and Tylenol PO (pre-op)*   Induction:   PONV Risk Score and Plan: 1 and Treatment may vary due to age or medical condition, Midazolam, Ondansetron and Dexamethasone  Airway Management Planned: LMA  Additional Equipment:   Intra-op Plan:   Post-operative Plan: Extubation in OR  Informed Consent: I have reviewed the patients History and Physical, chart, labs and discussed the procedure including the risks, benefits and alternatives for the proposed anesthesia with the patient or authorized representative who has indicated his/her understanding and  acceptance.     Dental advisory given  Plan Discussed with: CRNA  Anesthesia Plan Comments:        Anesthesia Quick Evaluation

## 2023-04-26 ENCOUNTER — Other Ambulatory Visit: Payer: Self-pay

## 2023-04-26 ENCOUNTER — Ambulatory Visit (HOSPITAL_COMMUNITY): Payer: Self-pay | Admitting: Anesthesiology

## 2023-04-26 ENCOUNTER — Ambulatory Visit (HOSPITAL_COMMUNITY): Payer: Self-pay | Admitting: Physician Assistant

## 2023-04-26 ENCOUNTER — Observation Stay (HOSPITAL_COMMUNITY)
Admission: RE | Admit: 2023-04-26 | Discharge: 2023-04-27 | Disposition: A | Discharge status: Home / Self Care | Payer: Medicare HMO | Attending: Orthopedic Surgery | Admitting: Orthopedic Surgery

## 2023-04-26 ENCOUNTER — Encounter (HOSPITAL_COMMUNITY)
Admission: RE | Disposition: A | Discharge status: Home / Self Care | Payer: Self-pay | Source: Home / Self Care | Attending: Orthopedic Surgery

## 2023-04-26 ENCOUNTER — Encounter (HOSPITAL_COMMUNITY): Payer: Self-pay | Admitting: Orthopedic Surgery

## 2023-04-26 DIAGNOSIS — Z79899 Other long term (current) drug therapy: Secondary | ICD-10-CM | POA: Diagnosis not present

## 2023-04-26 DIAGNOSIS — I2510 Atherosclerotic heart disease of native coronary artery without angina pectoris: Secondary | ICD-10-CM | POA: Insufficient documentation

## 2023-04-26 DIAGNOSIS — F17210 Nicotine dependence, cigarettes, uncomplicated: Secondary | ICD-10-CM | POA: Insufficient documentation

## 2023-04-26 DIAGNOSIS — M1711 Unilateral primary osteoarthritis, right knee: Secondary | ICD-10-CM

## 2023-04-26 DIAGNOSIS — Z96651 Presence of right artificial knee joint: Principal | ICD-10-CM

## 2023-04-26 DIAGNOSIS — Z951 Presence of aortocoronary bypass graft: Secondary | ICD-10-CM | POA: Diagnosis not present

## 2023-04-26 DIAGNOSIS — I10 Essential (primary) hypertension: Secondary | ICD-10-CM | POA: Insufficient documentation

## 2023-04-26 HISTORY — DX: Nicotine dependence, unspecified, uncomplicated: F17.200

## 2023-04-26 HISTORY — PX: TOTAL KNEE ARTHROPLASTY: SHX125

## 2023-04-26 SURGERY — ARTHROPLASTY, KNEE, TOTAL
Anesthesia: Regional | Site: Knee | Laterality: Right

## 2023-04-26 MED ORDER — HYDROMORPHONE HCL 2 MG/ML IJ SOLN
INTRAMUSCULAR | Status: AC
  Filled 2023-04-26: qty 1

## 2023-04-26 MED ORDER — FUROSEMIDE 20 MG PO TABS
20.0000 mg | ORAL_TABLET | Freq: Every day | ORAL | Status: DC
  Filled 2023-04-26: qty 1

## 2023-04-26 MED ORDER — FENTANYL CITRATE PF 50 MCG/ML IJ SOSY
PREFILLED_SYRINGE | INTRAMUSCULAR | Status: AC
  Administered 2023-04-26: 50 ug via INTRAVENOUS
  Filled 2023-04-26: qty 1

## 2023-04-26 MED ORDER — SODIUM CHLORIDE 0.9% FLUSH
10.0000 mL | Freq: Two times a day (BID) | INTRAVENOUS | Status: DC
Start: 2023-04-26 — End: 2023-04-26

## 2023-04-26 MED ORDER — SENNA 8.6 MG PO TABS: 2.0000 | ORAL_TABLET | Freq: Every day | ORAL | Status: DC

## 2023-04-26 MED ORDER — FENTANYL CITRATE PF 50 MCG/ML IJ SOSY: 25.0000 ug | PREFILLED_SYRINGE | INTRAMUSCULAR | Status: DC | PRN

## 2023-04-26 MED ORDER — FENTANYL CITRATE (PF) 100 MCG/2ML IJ SOLN
INTRAMUSCULAR | Status: AC
  Filled 2023-04-26: qty 2

## 2023-04-26 MED ORDER — DIPHENHYDRAMINE HCL 12.5 MG/5ML PO ELIX: 12.5000 mg | ORAL_SOLUTION | ORAL | Status: DC | PRN

## 2023-04-26 MED ORDER — ROPIVACAINE HCL 5 MG/ML IJ SOLN
INTRAMUSCULAR | Status: DC | PRN
  Administered 2023-04-26: 20 mL via PERINEURAL

## 2023-04-26 MED ORDER — TAMSULOSIN HCL 0.4 MG PO CAPS
0.4000 mg | ORAL_CAPSULE | Freq: Every day | ORAL | Status: DC
  Administered 2023-04-27: 0.4 mg via ORAL
  Filled 2023-04-26: qty 1

## 2023-04-26 MED ORDER — METOCLOPRAMIDE HCL 5 MG PO TABS: 5.0000 mg | ORAL_TABLET | Freq: Three times a day (TID) | ORAL | Status: DC | PRN

## 2023-04-26 MED ORDER — BISACODYL 10 MG RE SUPP: 10.0000 mg | Freq: Every day | RECTAL | Status: DC | PRN

## 2023-04-26 MED ORDER — DEXAMETHASONE SODIUM PHOSPHATE 10 MG/ML IJ SOLN
INTRAMUSCULAR | Status: DC | PRN
  Administered 2023-04-26: 10 mg

## 2023-04-26 MED ORDER — CHLORHEXIDINE GLUCONATE 0.12 % MT SOLN
15.0000 mL | Freq: Once | OROMUCOSAL | Status: AC
  Administered 2023-04-26: 15 mL via OROMUCOSAL

## 2023-04-26 MED ORDER — ALUM & MAG HYDROXIDE-SIMETH 200-200-20 MG/5ML PO SUSP: 30.0000 mL | ORAL | Status: DC | PRN

## 2023-04-26 MED ORDER — SODIUM CHLORIDE 0.9 % IR SOLN
Status: DC | PRN
  Administered 2023-04-26: 1000 mL

## 2023-04-26 MED ORDER — CEFAZOLIN SODIUM-DEXTROSE 2-4 GM/100ML-% IV SOLN
2.0000 g | INTRAVENOUS | Status: AC
  Administered 2023-04-26: 2 g via INTRAVENOUS
  Filled 2023-04-26: qty 100

## 2023-04-26 MED ORDER — FENTANYL CITRATE PF 50 MCG/ML IJ SOSY
100.0000 ug | PREFILLED_SYRINGE | Freq: Once | INTRAMUSCULAR | Status: AC
  Administered 2023-04-26: 50 ug via INTRAVENOUS
  Filled 2023-04-26: qty 2

## 2023-04-26 MED ORDER — EPHEDRINE SULFATE-NACL 50-0.9 MG/10ML-% IV SOSY
PREFILLED_SYRINGE | INTRAVENOUS | Status: DC | PRN
  Administered 2023-04-26: 5 mg via INTRAVENOUS

## 2023-04-26 MED ORDER — SODIUM CHLORIDE 0.9% FLUSH
10.0000 mL | Freq: Two times a day (BID) | INTRAVENOUS | Status: DC
  Administered 2023-04-27: 10 mL via INTRAVENOUS

## 2023-04-26 MED ORDER — AMLODIPINE BESYLATE 5 MG PO TABS
5.0000 mg | ORAL_TABLET | Freq: Every day | ORAL | Status: DC
  Filled 2023-04-26: qty 1

## 2023-04-26 MED ORDER — SODIUM CHLORIDE (PF) 0.9 % IJ SOLN
INTRAMUSCULAR | Status: DC | PRN
  Administered 2023-04-26: 61 mL

## 2023-04-26 MED ORDER — KETAMINE HCL 10 MG/ML IJ SOLN
INTRAMUSCULAR | Status: DC | PRN
  Administered 2023-04-26: 40 mg via INTRAVENOUS

## 2023-04-26 MED ORDER — MENTHOL 3 MG MT LOZG: 1.0000 | LOZENGE | OROMUCOSAL | Status: DC | PRN

## 2023-04-26 MED ORDER — PROPOFOL 500 MG/50ML IV EMUL
INTRAVENOUS | Status: DC | PRN
  Administered 2023-04-26: 125 ug/kg/min via INTRAVENOUS

## 2023-04-26 MED ORDER — HYDROMORPHONE HCL 1 MG/ML IJ SOLN: 0.5000 mg | INTRAMUSCULAR | Status: DC | PRN

## 2023-04-26 MED ORDER — OXYCODONE HCL 5 MG PO TABS
10.0000 mg | ORAL_TABLET | ORAL | Status: DC | PRN
Start: 2023-04-26 — End: 2023-04-27
  Administered 2023-04-27 (×3): 15 mg via ORAL
  Filled 2023-04-26 (×3): qty 3

## 2023-04-26 MED ORDER — METOCLOPRAMIDE HCL 5 MG/ML IJ SOLN: 5.0000 mg | Freq: Three times a day (TID) | INTRAMUSCULAR | Status: DC | PRN

## 2023-04-26 MED ORDER — ONDANSETRON HCL 4 MG/2ML IJ SOLN
4.0000 mg | Freq: Four times a day (QID) | INTRAMUSCULAR | Status: DC | PRN
Start: 2023-04-26 — End: 2023-04-27

## 2023-04-26 MED ORDER — METHOCARBAMOL 500 MG PO TABS
500.0000 mg | ORAL_TABLET | Freq: Four times a day (QID) | ORAL | Status: DC | PRN
  Administered 2023-04-26: 500 mg via ORAL
  Filled 2023-04-26: qty 1

## 2023-04-26 MED ORDER — OXYCODONE HCL 5 MG PO TABS
5.0000 mg | ORAL_TABLET | ORAL | Status: DC | PRN
Start: 2023-04-26 — End: 2023-04-27
  Administered 2023-04-26 (×2): 10 mg via ORAL
  Filled 2023-04-26 (×2): qty 2

## 2023-04-26 MED ORDER — ONDANSETRON HCL 4 MG/2ML IJ SOLN
INTRAMUSCULAR | Status: DC | PRN
  Administered 2023-04-26: 4 mg via INTRAVENOUS

## 2023-04-26 MED ORDER — LACTATED RINGERS IV SOLN: INTRAVENOUS | Status: DC | PRN

## 2023-04-26 MED ORDER — BUPIVACAINE-EPINEPHRINE 0.25% -1:200000 IJ SOLN
INTRAMUSCULAR | Status: AC
  Filled 2023-04-26: qty 1

## 2023-04-26 MED ORDER — ASPIRIN 81 MG PO CHEW
81.0000 mg | CHEWABLE_TABLET | Freq: Two times a day (BID) | ORAL | Status: DC
  Administered 2023-04-26 – 2023-04-27 (×2): 81 mg via ORAL
  Filled 2023-04-26 (×2): qty 1

## 2023-04-26 MED ORDER — CEFAZOLIN SODIUM-DEXTROSE 2-4 GM/100ML-% IV SOLN
2.0000 g | Freq: Four times a day (QID) | INTRAVENOUS | Status: AC
  Administered 2023-04-26 (×2): 2 g via INTRAVENOUS
  Filled 2023-04-26 (×2): qty 100

## 2023-04-26 MED ORDER — ACETAMINOPHEN 500 MG PO TABS
1000.0000 mg | ORAL_TABLET | Freq: Four times a day (QID) | ORAL | Status: DC
  Administered 2023-04-26 – 2023-04-27 (×3): 1000 mg via ORAL
  Filled 2023-04-26 (×3): qty 2

## 2023-04-26 MED ORDER — PHENOL 1.4 % MT LIQD: 1.0000 | OROMUCOSAL | Status: DC | PRN

## 2023-04-26 MED ORDER — KETAMINE HCL 50 MG/5ML IJ SOSY
PREFILLED_SYRINGE | INTRAMUSCULAR | Status: AC
  Filled 2023-04-26: qty 5

## 2023-04-26 MED ORDER — EPHEDRINE SULFATE (PRESSORS) 50 MG/ML IJ SOLN
INTRAMUSCULAR | Status: DC | PRN
  Administered 2023-04-26 (×4): 5 mg via INTRAVENOUS

## 2023-04-26 MED ORDER — DEXAMETHASONE SODIUM PHOSPHATE 10 MG/ML IJ SOLN
10.0000 mg | Freq: Once | INTRAMUSCULAR | Status: AC
  Administered 2023-04-27: 10 mg via INTRAVENOUS
  Filled 2023-04-26: qty 1

## 2023-04-26 MED ORDER — TRANEXAMIC ACID-NACL 1000-0.7 MG/100ML-% IV SOLN
1000.0000 mg | INTRAVENOUS | Status: AC
  Administered 2023-04-26: 1000 mg via INTRAVENOUS
  Filled 2023-04-26: qty 100

## 2023-04-26 MED ORDER — METHOCARBAMOL 1000 MG/10ML IJ SOLN: 500.0000 mg | Freq: Four times a day (QID) | INTRAMUSCULAR | Status: DC | PRN

## 2023-04-26 MED ORDER — SODIUM CHLORIDE (PF) 0.9 % IJ SOLN
INTRAMUSCULAR | Status: AC
  Filled 2023-04-26: qty 30

## 2023-04-26 MED ORDER — EZETIMIBE 10 MG PO TABS
10.0000 mg | ORAL_TABLET | Freq: Every day | ORAL | Status: DC
  Filled 2023-04-26: qty 1

## 2023-04-26 MED ORDER — STERILE WATER FOR IRRIGATION IR SOLN
Status: DC | PRN
  Administered 2023-04-26: 1000 mL

## 2023-04-26 MED ORDER — MIDAZOLAM HCL 2 MG/2ML IJ SOLN
2.0000 mg | Freq: Once | INTRAMUSCULAR | Status: AC
  Administered 2023-04-26: 2 mg via INTRAVENOUS
  Filled 2023-04-26: qty 2

## 2023-04-26 MED ORDER — DEXAMETHASONE SODIUM PHOSPHATE 10 MG/ML IJ SOLN
8.0000 mg | Freq: Once | INTRAMUSCULAR | Status: AC
  Administered 2023-04-26: 8 mg via INTRAVENOUS

## 2023-04-26 MED ORDER — POVIDONE-IODINE 10 % EX SWAB: 2.0000 | Freq: Once | CUTANEOUS | Status: AC

## 2023-04-26 MED ORDER — TRANEXAMIC ACID-NACL 1000-0.7 MG/100ML-% IV SOLN
1000.0000 mg | Freq: Once | INTRAVENOUS | Status: AC
  Administered 2023-04-26: 1000 mg via INTRAVENOUS
  Filled 2023-04-26: qty 100

## 2023-04-26 MED ORDER — PROPOFOL 10 MG/ML IV BOLUS
INTRAVENOUS | Status: DC | PRN
  Administered 2023-04-26: 150 mg via INTRAVENOUS
  Administered 2023-04-26: 50 mg via INTRAVENOUS

## 2023-04-26 MED ORDER — FENTANYL CITRATE (PF) 100 MCG/2ML IJ SOLN
INTRAMUSCULAR | Status: DC | PRN
  Administered 2023-04-26 (×2): 50 ug via INTRAVENOUS

## 2023-04-26 MED ORDER — POLYETHYLENE GLYCOL 3350 17 G PO PACK
17.0000 g | PACK | Freq: Two times a day (BID) | ORAL | Status: DC
  Administered 2023-04-27: 17 g via ORAL
  Filled 2023-04-26: qty 1

## 2023-04-26 MED ORDER — METOPROLOL SUCCINATE ER 25 MG PO TB24
12.5000 mg | ORAL_TABLET | Freq: Every day | ORAL | Status: DC
  Filled 2023-04-26: qty 1

## 2023-04-26 MED ORDER — 0.9 % SODIUM CHLORIDE (POUR BTL) OPTIME
TOPICAL | Status: DC | PRN
  Administered 2023-04-26: 1000 mL

## 2023-04-26 MED ORDER — HYDROMORPHONE HCL 1 MG/ML IJ SOLN
INTRAMUSCULAR | Status: DC | PRN
  Administered 2023-04-26 (×3): .4 mg via INTRAVENOUS

## 2023-04-26 MED ORDER — LACTATED RINGERS IV SOLN: INTRAVENOUS | Status: DC

## 2023-04-26 MED ORDER — CITALOPRAM HYDROBROMIDE 20 MG PO TABS
40.0000 mg | ORAL_TABLET | Freq: Every day | ORAL | Status: DC
  Filled 2023-04-26: qty 2

## 2023-04-26 MED ORDER — ONDANSETRON HCL 4 MG PO TABS: 4.0000 mg | ORAL_TABLET | Freq: Four times a day (QID) | ORAL | Status: DC | PRN

## 2023-04-26 MED ORDER — KETOROLAC TROMETHAMINE 30 MG/ML IJ SOLN
INTRAMUSCULAR | Status: AC
  Filled 2023-04-26: qty 1

## 2023-04-26 MED ORDER — PHENYLEPHRINE 80 MCG/ML (10ML) SYRINGE FOR IV PUSH (FOR BLOOD PRESSURE SUPPORT)
PREFILLED_SYRINGE | INTRAVENOUS | Status: DC | PRN
  Administered 2023-04-26: 80 ug via INTRAVENOUS

## 2023-04-26 MED ORDER — ACETAMINOPHEN 500 MG PO TABS
1000.0000 mg | ORAL_TABLET | Freq: Once | ORAL | Status: AC
  Administered 2023-04-26: 1000 mg via ORAL
  Filled 2023-04-26: qty 2

## 2023-04-26 SURGICAL SUPPLY — 45 items
ATTUNE MED ANAT PAT 41 KNEE (Knees) IMPLANT
BAG COUNTER SPONGE SURGICOUNT (BAG) IMPLANT
BAG ZIPLOCK 12X15 (MISCELLANEOUS) IMPLANT
BASEPLATE TIB CMT FB PCKT SZ 8 (Knees) IMPLANT
BLADE SAW SGTL 13.0X1.19X90.0M (BLADE) ×1 IMPLANT
BNDG ELASTIC 6INX 5YD STR LF (GAUZE/BANDAGES/DRESSINGS) ×1 IMPLANT
BOWL SMART MIX CTS (DISPOSABLE) ×1 IMPLANT
CEMENT HV SMART SET (Cement) ×2 IMPLANT
COMP FEM CMT ATTUNE KNEE 7 RT (Joint) ×1 IMPLANT
COMPONENT FEM CMT ATTN KN 7 RT (Joint) IMPLANT
COVER SURGICAL LIGHT HANDLE (MISCELLANEOUS) ×1 IMPLANT
CUFF TRNQT CYL 34X4.125X (TOURNIQUET CUFF) ×1 IMPLANT
DERMABOND ADVANCED .7 DNX12 (GAUZE/BANDAGES/DRESSINGS) ×1 IMPLANT
DRAPE U-SHAPE 47X51 STRL (DRAPES) ×1 IMPLANT
DRESSING AQUACEL AG SP 3.5X10 (GAUZE/BANDAGES/DRESSINGS) ×1 IMPLANT
DRSG AQUACEL AG SP 3.5X10 (GAUZE/BANDAGES/DRESSINGS) ×1 IMPLANT
DURAPREP 26ML APPLICATOR (WOUND CARE) ×2 IMPLANT
ELECT REM PT RETURN 15FT ADLT (MISCELLANEOUS) ×1 IMPLANT
GLOVE BIO SURGEON STRL SZ 6 (GLOVE) ×1 IMPLANT
GLOVE BIOGEL PI IND STRL 6.5 (GLOVE) ×1 IMPLANT
GLOVE BIOGEL PI IND STRL 7.5 (GLOVE) ×1 IMPLANT
GLOVE ORTHO TXT STRL SZ7.5 (GLOVE) ×2 IMPLANT
GOWN STRL REUS W/ TWL LRG LVL3 (GOWN DISPOSABLE) ×2 IMPLANT
HOLDER FOLEY CATH W/STRAP (MISCELLANEOUS) IMPLANT
INSERT TIB MED ATTUNE 7 7 RT (Insert) IMPLANT
KIT TURNOVER KIT A (KITS) IMPLANT
MANIFOLD NEPTUNE II (INSTRUMENTS) ×1 IMPLANT
NDL SAFETY ECLIPSE 18X1.5 (NEEDLE) IMPLANT
NS IRRIG 1000ML POUR BTL (IV SOLUTION) ×1 IMPLANT
PACK TOTAL KNEE CUSTOM (KITS) ×1 IMPLANT
PIN FIX SIGMA LCS THRD HI (PIN) IMPLANT
PROTECTOR NERVE ULNAR (MISCELLANEOUS) ×1 IMPLANT
SET HNDPC FAN SPRY TIP SCT (DISPOSABLE) ×1 IMPLANT
SET PAD KNEE POSITIONER (MISCELLANEOUS) ×1 IMPLANT
SPIKE FLUID TRANSFER (MISCELLANEOUS) ×2 IMPLANT
SUT MNCRL AB 4-0 PS2 18 (SUTURE) ×1 IMPLANT
SUT STRATAFIX PDS+ 0 24IN (SUTURE) ×1 IMPLANT
SUT VIC AB 1 CT1 36 (SUTURE) ×1 IMPLANT
SUT VIC AB 2-0 CT1 TAPERPNT 27 (SUTURE) ×2 IMPLANT
SYR 3ML LL SCALE MARK (SYRINGE) ×1 IMPLANT
TOWEL GREEN STERILE FF (TOWEL DISPOSABLE) ×1 IMPLANT
TRAY FOLEY MTR SLVR 16FR STAT (SET/KITS/TRAYS/PACK) ×1 IMPLANT
TUBE SUCTION HIGH CAP CLEAR NV (SUCTIONS) ×1 IMPLANT
WATER STERILE IRR 1000ML POUR (IV SOLUTION) ×2 IMPLANT
WRAP KNEE MAXI GEL POST OP (GAUZE/BANDAGES/DRESSINGS) ×1 IMPLANT

## 2023-04-26 NOTE — H&P (Signed)
TOTAL KNEE ADMISSION H&P  Patient is being admitted for right total knee arthroplasty.  Therapy Plans: outpatient therapy at [facility] in [place] Disposition: Home with wife Planned DVT Prophylaxis: aspirin 81mg  BID DME needed: ice machine PCP: Dr. Adria Dill - clearance received Cardio: Dr. Catha Gosselin - clearance received (will stay on ASA) TXA: IV Allergies: NKDA Anesthesia Concerns: none BMI: 32.8 Last HgbA1c: Not diabetic   Other: - Hydrocodone 10 mg TID at baseline - oxycodone, robaxin, tylenol - hx of a fib > only on aspirin now  Subjective:  Chief Complaint:right knee pain.  HPI: Brian Franco, 67 y.o. male, has a history of pain and functional disability in the right knee due to arthritis and has failed non-surgical conservative treatments for greater than 12 weeks to includeNSAID's and/or analgesics, corticosteriod injections, and activity modification.  Onset of symptoms was gradual, starting 2 years ago with gradually worsening course since that time. The patient noted prior procedures on the knee to include  arthroscopy and menisectomy on the right knee(s).  Patient currently rates pain in the right knee(s) at 7 out of 10 with activity. Patient has worsening of pain with activity and weight bearing and pain that interferes with activities of daily living.  Patient has evidence of joint space narrowing by imaging studies. . There is no active infection.  Patient Active Problem List   Diagnosis Date Noted   Herniated lumbar disc without myelopathy 11/27/2020   Spinal stenosis of lumbar region with radiculopathy 07/17/2019   Degenerative lumbar spinal stenosis 07/05/2017   Past Medical History:  Diagnosis Date   Arthritis    Coronary artery disease    History of kidney stones    Hypertension    Lumbar stenosis with neurogenic claudication    Sleep apnea    Uses Cpap nightly    Past Surgical History:  Procedure Laterality Date   ANTERIOR LAT LUMBAR FUSION Left  04/03/2019   Procedure: Left Lumbar Three-Four Anterolateral interbody fusion with lateral plate;  Surgeon: Maeola Harman, MD;  Location: [facility] OR;  Service: Neurosurgery;  Laterality: Left;  Left Lumbar Three-Four Anterolateral interbody fusion with lateral plate   APPENDECTOMY     BACK SURGERY     Laminectomy L5-S1 - x 3   CARPAL TUNNEL RELEASE Right 07/17/2019   Procedure: Right carpal tunnel release;  Surgeon: Maeola Harman, MD;  Location: [facility] OR;  Service: Neurosurgery;  Laterality: Right;   CARPAL TUNNEL RELEASE Left    COLONOSCOPY     CORONARY ARTERY BYPASS GRAFT     KNEE ARTHROSCOPY     Right   TRANSFORAMINAL LUMBAR INTERBODY FUSION (TLIF) WITH PEDICLE SCREW FIXATION 1 LEVEL Left 11/27/2020   Procedure: Left Lumbar Two-Three Transforaminal lumbar interbody fusion with exploration of adjacent level fixation;  Surgeon: Maeola Harman, MD;  Location: [facility] OR;  Service: Neurosurgery;  Laterality: Left;  Left Lumbar Two-Three Transforaminal lumbar interbody fusion with exploration of adjacent level fixation   WISDOM TOOTH EXTRACTION      No current facility-administered medications for this encounter.   Current Outpatient Medications  Medication Sig Dispense Refill Last Dose/Taking   amLODipine (NORVASC) 5 MG tablet Take 5 mg by mouth daily.   Taking   citalopram (CELEXA) 40 MG tablet Take 40 mg by mouth daily.   Taking   ezetimibe (ZETIA) 10 MG tablet Take 10 mg by mouth daily.   Taking   furosemide (LASIX) 20 MG tablet Take 20 mg by mouth daily.   Taking   HYDROcodone-acetaminophen (NORCO) 10-325  MG tablet Take 1 tablet by mouth 3 (three) times daily.   Taking   ibuprofen (ADVIL) 800 MG tablet Take 800 mg by mouth 3 (three) times daily.   Taking   iron polysaccharides (NIFEREX) 150 MG capsule Take 150 mg by mouth daily.   Taking   metoprolol succinate (TOPROL-XL) 25 MG 24 hr tablet Take 12.5 mg by mouth daily.   Taking   REPATHA SURECLICK 140 MG/ML SOAJ Inject 140 mg as directed every 14  (fourteen) days.   Taking   tamsulosin (FLOMAX) 0.4 MG CAPS capsule Take 0.4 mg by mouth daily.   Taking   No Known Allergies  Social History   Tobacco Use   Smoking status: Every Day    Current packs/day: 0.00    Average packs/day: 1.5 packs/day for 43.0 years (64.5 ttl pk-yrs)    Types: Cigarettes    Start date: 03/23/1978    Last attempt to quit: 03/23/2021    Years since quitting: 2.0   Smokeless tobacco: Never  Substance Use Topics   Alcohol use: Yes    Comment: occasional Beer    No family history on file.   Review of Systems  Constitutional:  Negative for chills and fever.  Respiratory:  Negative for cough and shortness of breath.   Cardiovascular:  Negative for chest pain.  Gastrointestinal:  Negative for nausea and vomiting.  Musculoskeletal:  Positive for arthralgias.     Objective:  Physical Exam Well nourished and well developed. General: Alert and oriented x3, cooperative and pleasant, no acute distress. Head: normocephalic, atraumatic, neck supple. Eyes: EOMI.  Musculoskeletal: Right knee exam: No palpable effusion, warmth erythema Slight flexion contracture with flexion close to 120 degrees with tightness in the anterior medial aspect the knee Tenderness over the medial and anterior aspect knee Stable medial and lateral collateral ligaments Normal ipsilateral right hip exam with slight tightness but no referred groin pain  Left knee exam: No palpable effusion Full knee extension and flexion with mild tenderness medially  Calves soft and nontender. Motor function intact in LE. Strength 5/5 LE bilaterally.  Vital signs in last 24 hours:    Labs:   Estimated body mass index is 32.14 kg/m as calculated from the following:   Height as of 04/21/23: 6\' 4"  (1.93 m).   Weight as of 04/21/23: 119.7 kg.   Imaging Review Plain radiographs demonstrate severe degenerative joint disease of the right knee(s). The overall alignment isneutral. The bone  quality appears to be adequate for age and reported activity level.      Assessment/Plan:  End stage arthritis, right knee   The patient history, physical examination, clinical judgment of the provider and imaging studies are consistent with end stage degenerative joint disease of the right knee(s) and total knee arthroplasty is deemed medically necessary. The treatment options including medical management, injection therapy arthroscopy and arthroplasty were discussed at length. The risks and benefits of total knee arthroplasty were presented and reviewed. The risks due to aseptic loosening, infection, stiffness, patella tracking problems, thromboembolic complications and other imponderables were discussed. The patient acknowledged the explanation, agreed to proceed with the plan and consent was signed. Patient is being admitted for inpatient treatment for surgery, pain control, PT, OT, prophylactic antibiotics, VTE prophylaxis, progressive ambulation and ADL's and discharge planning. The patient is planning to be discharged  home.     Patient's anticipated LOS is less than 2 midnights, meeting these requirements: - Younger than 82 - Lives within 1 hour of care -  Has a competent adult at home to recover with post-op recover - NO history of  - Chronic pain requiring opiods  - Diabetes  - Coronary Artery Disease  - Heart failure  - Heart attack  - Stroke  - DVT/VTE  - Cardiac arrhythmia  - Respiratory Failure/COPD  - Renal failure  - Anemia  - Advanced Liver disease  Rosalene Billings, PA-C Orthopedic Surgery [facility] [place] [phone]

## 2023-04-26 NOTE — Discharge Instructions (Signed)

## 2023-04-26 NOTE — Anesthesia Postprocedure Evaluation (Signed)
Anesthesia Post Note  Patient: Brian Franco  Procedure(s) Performed: TOTAL KNEE ARTHROPLASTY (Right: Knee)     Patient location during evaluation: PACU Anesthesia Type: Regional and General Level of consciousness: awake and alert Pain management: pain level controlled Vital Signs Assessment: post-procedure vital signs reviewed and stable Respiratory status: spontaneous breathing, nonlabored ventilation, respiratory function stable and patient connected to nasal cannula oxygen Cardiovascular status: blood pressure returned to baseline and stable Postop Assessment: no apparent nausea or vomiting Anesthetic complications: no  No notable events documented.  Last Vitals:  Vitals:   04/26/23 1330 04/26/23 1357  BP: 117/70 124/70  Pulse: (!) 51 (!) 54  Resp: 13 15  Temp:  (!) 36.4 C  SpO2: 93%     Last Pain:  Vitals:   04/26/23 1330  TempSrc:   PainSc: 0-No pain                 Catheline Hixon L Calli Bashor

## 2023-04-26 NOTE — Interval H&P Note (Signed)
History and Physical Interval Note:  04/26/2023 9:31 AM  Brian Franco  has presented today for surgery, with the diagnosis of Right knee osteoarthritis.  The various methods of treatment have been discussed with the patient and family. After consideration of risks, benefits and other options for treatment, the patient has consented to  Procedure(s): TOTAL KNEE ARTHROPLASTY (Right) as a surgical intervention.  The patient's history has been reviewed, patient examined, no change in status, stable for surgery.  I have reviewed the patient's chart and labs.  Questions were answered to the patient's satisfaction.     Shelda Pal

## 2023-04-26 NOTE — Transfer of Care (Signed)
Immediate Anesthesia Transfer of Care Note  Patient: ANUJ GUNNOE  Procedure(s) Performed: TOTAL KNEE ARTHROPLASTY (Right: Knee)  Patient Location: PACU  Anesthesia Type:General  Level of Consciousness: awake, alert , oriented, and patient cooperative  Airway & Oxygen Therapy: Patient Spontanous Breathing and Patient connected to face mask oxygen  Post-op Assessment: Report given to RN, Post -op Vital signs reviewed and stable, and Patient moving all extremities  Post vital signs: Reviewed and stable  Last Vitals:  Vitals Value Taken Time  BP 119/71 04/26/23 1203  Temp    Pulse 51 04/26/23 1206  Resp 17 04/26/23 1206  SpO2 92 % 04/26/23 1206  Vitals shown include unfiled device data.  Last Pain:  Vitals:   04/26/23 0935  TempSrc:   PainSc: 0-No pain         Complications: No notable events documented.

## 2023-04-26 NOTE — Evaluation (Signed)
Physical Therapy Evaluation Patient Details Name: Brian Franco MRN: [id number] DOB: 03-12-1956 Today's Date: 04/26/2023  History of Present Illness  67 yo male S/P R TKA 04/26/23. PMH: Lumbar fusion, CABG, CAD  Clinical Impression  Pt admitted with above diagnosis.  Pt currently with functional limitations due to the deficits listed below (see PT Problem List). Pt will benefit from acute skilled PT to increase their independence and safety with mobility to allow discharge.    The  right knee slightly buckling  during WB , Ambukated x 8' with support at the knee. Patient should progress to return home tomoorw.        If plan is discharge home, recommend the following: A little help with walking and/or transfers;Assistance with cooking/housework;Assist for transportation;Help with stairs or ramp for entrance   Can travel by private vehicle        Equipment Recommendations None recommended by PT  Recommendations for Other Services       Functional Status Assessment Patient has had a recent decline in their functional status and demonstrates the ability to make significant improvements in function in a reasonable and predictable amount of time.     Precautions / Restrictions Precautions Precautions: Fall;Knee Precaution Comments: right  knee buckling Restrictions Weight Bearing Restrictions Per Provider Order: No      Mobility  Bed Mobility Overal bed mobility: Needs Assistance Bed Mobility: Supine to Sit     Supine to sit: Contact guard     General bed mobility comments: cues for safety    Transfers Overall transfer level: Needs assistance Equipment used: Rolling walker (2 wheels) Transfers: Sit to/from Stand Sit to Stand: From elevated surface, Min assist           General transfer comment: cues to hold RW    Ambulation/Gait Ambulation/Gait assistance: Mod assist, +2 safety/equipment Gait Distance (Feet): 8 Feet Assistive device: Rolling walker (2  wheels) Gait Pattern/deviations: Step-to pattern Gait velocity: decr     General Gait Details: Right knee buckling, provided support at knee  for 8' distance  Stairs            Wheelchair Mobility     Tilt Bed    Modified Rankin (Stroke Patients Only)       Balance Overall balance assessment: Mild deficits observed, not formally tested                                           Pertinent Vitals/Pain Pain Assessment Pain Assessment: 0-10 Pain Score: 4  Pain Location: right knee Pain Descriptors / Indicators: Discomfort Pain Intervention(s): Monitored during session, Premedicated before session, Ice applied    Home Living Family/patient expects to be discharged to:: Private residence Living Arrangements: Spouse/significant other Available Help at Discharge: Family Type of Home: House Home Access: Stairs to enter Entrance Stairs-Rails: None Entrance Stairs-Number of Steps: 4   Home Layout: One level Home Equipment: Agricultural consultant (2 wheels);Shower seat;BSC/3in1      Prior Function Prior Level of Function : Independent/Modified Independent                     Extremity/Trunk Assessment   Upper Extremity Assessment Upper Extremity Assessment: Overall WFL for tasks assessed    Lower Extremity Assessment Lower Extremity Assessment: RLE deficits/detail RLE Deficits / Details: able to SLR, k=noted knee buckling with ambulation    Cervical / Trunk  Assessment Cervical / Trunk Assessment: Normal  Communication   Communication Communication: No apparent difficulties  Cognition Arousal: Alert Behavior During Therapy: WFL for tasks assessed/performed Overall Cognitive Status: Within Functional Limits for tasks assessed                                          General Comments      Exercises     Assessment/Plan    PT Assessment Patient needs continued PT services  PT Problem List Decreased strength;Decreased  mobility;Decreased range of motion;Decreased knowledge of precautions;Decreased activity tolerance;Pain       PT Treatment Interventions DME instruction;Therapeutic activities;Gait training;Therapeutic exercise;Patient/family education;Stair training;Functional mobility training    PT Goals (Current goals can be found in the Care Plan section)  Acute Rehab PT Goals Patient Stated Goal: to go home PT Goal Formulation: With patient/family Time For Goal Achievement: 05/03/23 Potential to Achieve Goals: Good    Frequency 7X/week     Co-evaluation               AM-PAC PT "6 Clicks" Mobility  Outcome Measure Help needed turning from your back to your side while in a flat bed without using bedrails?: A Little Help needed moving from lying on your back to sitting on the side of a flat bed without using bedrails?: A Little Help needed moving to and from a bed to a chair (including a wheelchair)?: A Little Help needed standing up from a chair using your arms (e.g., wheelchair or bedside chair)?: A Little Help needed to walk in hospital room?: A Lot Help needed climbing 3-5 steps with a railing? : A Lot 6 Click Score: 16    End of Session Equipment Utilized During Treatment: Gait belt Activity Tolerance: Patient tolerated treatment well Patient left: in chair;with call bell/phone within reach;with chair alarm set;with family/visitor present Nurse Communication: Mobility status PT Visit Diagnosis: Unsteadiness on feet (R26.81);Muscle weakness (generalized) (M62.81);Difficulty in walking, not elsewhere classified (R26.2);Other symptoms and signs involving the nervous system (R29.898)    Time: 6213-0865 PT Time Calculation (min) (ACUTE ONLY): 14 min   Charges:   PT Evaluation $PT Eval Low Complexity: 1 Low   PT General Charges $$ ACUTE PT VISIT: 1 Visit         [facility]anchard Kelch PT Acute Rehabilitation Services Office [phone] Weekend [phone]   Rada Hay 04/26/2023, 5:06 PM

## 2023-04-26 NOTE — Anesthesia Procedure Notes (Signed)
Anesthesia Regional Block: Adductor canal block   Pre-Anesthetic Checklist: , timeout performed,  Correct Patient, Correct Site, Correct Laterality,  Correct Procedure, Correct Position, site marked,  Risks and benefits discussed,  Pre-op evaluation,  At surgeon's request and post-op pain management  Laterality: Right  Prep: Maximum Sterile Barrier Precautions used, chloraprep       Needles:  Injection technique: Single-shot  Needle Type: Echogenic Stimulator Needle     Needle Length: 9cm  Needle Gauge: 21     Additional Needles:   Procedures:,,,, ultrasound used (permanent image in chart),,    Narrative:  Start time: 04/26/2023 8:56 AM End time: 04/26/2023 8:59 AM Injection made incrementally with aspirations every 5 mL. Anesthesiologist: Elmer Picker, MD

## 2023-04-26 NOTE — Op Note (Addendum)
NAME:  Brian Franco                      MEDICAL RECORD NO.:  [id number]                             FACILITY:  [facility]      PHYSICIAN:  Madlyn Frankel. Charlann Boxer, M.D.  DATE OF BIRTH:  03/19/1956      DATE OF PROCEDURE:  04/26/2023                                     OPERATIVE REPORT         PREOPERATIVE DIAGNOSIS:  Right knee osteoarthritis.      POSTOPERATIVE DIAGNOSIS:  Right knee osteoarthritis.      FINDINGS:  The patient was noted to have complete loss of cartilage and   bone-on-bone arthritis with associated osteophytes in the medial and patellofemoral compartments of   the knee with a significant synovitis and associated effusion.  The patient had failed months of conservative treatment including medications, injection therapy, activity modification.     PROCEDURE:  Right total knee replacement.      COMPONENTS USED:  DePuy Attune FB CR MS knee   system, a size 7 femur, 8 tibia, size 7 mm CR MS AOX insert, and 41 anatomic patellar   button.      SURGEON:  Madlyn Frankel. Charlann Boxer, M.D.      ASSISTANT:  Rosalene Billings, PA-C.      ANESTHESIA:  General and Regional.      SPECIMENS:  None.      COMPLICATION:  None.      DRAINS:  None.  EBL: <100 cc      TOURNIQUET TIME:  31 min at 225 mmHg     The patient was stable to the recovery room.      INDICATION FOR PROCEDURE:  Brian Franco is a 67 y.o. male patient of   mine.  The patient had been seen, evaluated, and treated for months conservatively in the   office with medication, activity modification, and injections.  The patient had   radiographic changes of bone-on-bone arthritis with endplate sclerosis and osteophytes noted.  Based on the radiographic changes and failed conservative measures, the patient   decided to proceed with definitive treatment, total knee replacement.  Risks of infection, DVT, component failure, need for revision surgery, neurovascular injury were reviewed in the office setting.  The postop course was  reviewed stressing the efforts to maximize post-operative satisfaction and function.  Consent was obtained for benefit of pain   relief.      PROCEDURE IN DETAIL:  The patient was brought to the operative theater.   Once adequate anesthesia, preoperative antibiotics, 2 gm of Ancef,1 gm of Tranexamic Acid, and 10 mg of Decadron administered, the patient was positioned supine with a right thigh tourniquet placed.  The  right lower extremity was prepped and draped in sterile fashion.  A time-   out was performed identifying the patient, planned procedure, and the appropriate extremity.      The right lower extremity was placed in the [facility] leg holder.  The leg was   exsanguinated, tourniquet elevated to 225 mmHg.  A midline incision was   made followed by median parapatellar arthrotomy.  Following initial   exposure, attention was  first directed to the patella.  Precut   measurement was noted to be 25 mm.  I resected down to 14 mm and used a   41 anatomic patellar button to restore patellar height as well as cover the cut surface.      The lug holes were drilled and a metal shim was placed to protect the   patella from retractors and saw blade during the procedure.      At this point, attention was now directed to the femur.  The femoral   canal was opened with a drill, irrigated to try to prevent fat emboli.  An   intramedullary rod was passed at 5 degrees valgus, 9 mm of bone was   resected off the distal femur.  Following this resection, the tibia was   subluxated anteriorly.  Using the extramedullary guide, 2 mm of bone was resected off   the proximal medial tibia.  We confirmed the gap would be   stable medially and laterally with a size 5 spacer block as well as confirmed that the tibial cut was perpendicular in the coronal plane, checking with an alignment rod.      Once this was done, I sized the femur to be a size 7 in the anterior-   posterior dimension, chose a standard component  based on medial and   lateral dimension.  The size 7 rotation block was then pinned in   position anterior referenced using the C-clamp to set rotation.  The   anterior, posterior, and  chamfer cuts were made without difficulty nor   notching making certain that I was along the anterior cortex to help   with flexion gap stability.      The final box cut was made off the lateral aspect of distal femur.      At this point, the tibia was sized to be a size 8.  The size 8 tray was   then pinned in position through the medial third of the tubercle,   drilled, and keel punched.  Trial reduction was now carried with a 7 femur,  8 tibia, a size 7 mm CR MS insert, and the 41 anatomic patella botton.  The knee was brought to full extension with good flexion stability with the patella   tracking through the trochlea without application of pressure.  Given   all these findings the trial components removed.  Final components were   opened and cement was mixed.  The knee was irrigated with normal saline solution and pulse lavage.  The synovial lining was   then injected with 30 cc of 0.25% Marcaine with epinephrine, 1 cc of Toradol and 30 cc of NS for a total of 61 cc.     Final implants were then cemented onto cleaned and dried cut surfaces of bone with the knee brought to extension with a size 7 mm CR MS trial insert.      Once the cement had fully cured, excess cement was removed   throughout the knee.  I confirmed that I was satisfied with the range of   motion and stability, and the final size 7 mm CR MS AOX insert was chosen.  It was   placed into the knee.      The tourniquet had been let down at 31 minutes.  No significant   hemostasis was required.  The extensor mechanism was then reapproximated using #1 Vicryl and #1 Stratafix sutures with the knee   in flexion.  The   remaining wound was closed with 2-0 Vicryl and running 4-0 Monocryl.   The knee was cleaned, dried, dressed sterilely using  Dermabond and   Aquacel dressing.  The patient was then   brought to recovery room in stable condition, tolerating the procedure   well.   Please note that Physician Assistant, Rosalene Billings, PA-C was present for the entirety of the case, and was utilized for pre-operative positioning, peri-operative retractor management, general facilitation of the procedure and for primary wound closure at the end of the case.              Madlyn Frankel Charlann Boxer, M.D.    04/26/2023 9:31 AM

## 2023-04-26 NOTE — Anesthesia Procedure Notes (Signed)
Procedure Name: LMA Insertion Date/Time: 04/26/2023 10:42 AM  Performed by: Deri Fuelling, CRNAPre-anesthesia Checklist: Patient identified, Emergency Drugs available, Suction available and Patient being monitored Patient Re-evaluated:Patient Re-evaluated prior to induction Oxygen Delivery Method: Circle system utilized Preoxygenation: Pre-oxygenation with 100% oxygen Induction Type: IV induction Ventilation: Mask ventilation without difficulty LMA: LMA inserted LMA Size: 5.0 Tube type: Oral Number of attempts: 1 Airway Equipment and Method: Stylet and Oral airway Placement Confirmation: ETT inserted through vocal cords under direct vision, positive ETCO2 and breath sounds checked- equal and bilateral Tube secured with: Tape Dental Injury: Teeth and Oropharynx as per pre-operative assessment

## 2023-04-27 ENCOUNTER — Encounter (HOSPITAL_COMMUNITY): Payer: Self-pay | Admitting: Orthopedic Surgery

## 2023-04-27 DIAGNOSIS — M1711 Unilateral primary osteoarthritis, right knee: Secondary | ICD-10-CM | POA: Diagnosis not present

## 2023-04-27 LAB — CBC
HCT: 40.6 % (ref 39.0–52.0)
Hemoglobin: 14.1 g/dL (ref 13.0–17.0)
MCH: 32.1 pg (ref 26.0–34.0)
MCHC: 34.7 g/dL (ref 30.0–36.0)
MCV: 92.5 fL (ref 80.0–100.0)
Platelets: 195 10*3/uL (ref 150–400)
RBC: 4.39 MIL/uL (ref 4.22–5.81)
RDW: 12.2 % (ref 11.5–15.5)
WBC: 22 10*3/uL — ABNORMAL HIGH (ref 4.0–10.5)
nRBC: 0 % (ref 0.0–0.2)

## 2023-04-27 LAB — BASIC METABOLIC PANEL
Anion gap: 9 (ref 5–15)
BUN: 21 mg/dL (ref 8–23)
CO2: 21 mmol/L — ABNORMAL LOW (ref 22–32)
Calcium: 8.4 mg/dL — ABNORMAL LOW (ref 8.9–10.3)
Chloride: 104 mmol/L (ref 98–111)
Creatinine, Ser: 0.69 mg/dL (ref 0.61–1.24)
GFR, Estimated: >60 mL/min (ref >60)
Glucose, Bld: 120 mg/dL — ABNORMAL HIGH (ref 70–99)
Potassium: 4.2 mmol/L (ref 3.5–5.1)
Sodium: 134 mmol/L — ABNORMAL LOW (ref 135–145)

## 2023-04-27 MED ORDER — METHOCARBAMOL 500 MG PO TABS: 500.0000 mg | ORAL_TABLET | Freq: Four times a day (QID) | ORAL | 2 refills | Status: AC | PRN

## 2023-04-27 MED ORDER — ASPIRIN 81 MG PO CHEW: 81.0000 mg | CHEWABLE_TABLET | Freq: Two times a day (BID) | ORAL | 0 refills | Status: AC

## 2023-04-27 MED ORDER — OXYCODONE HCL 5 MG PO TABS: 5.0000 mg | ORAL_TABLET | ORAL | 0 refills | Status: AC | PRN

## 2023-04-27 MED ORDER — POLYETHYLENE GLYCOL 3350 17 G PO PACK: 17.0000 g | PACK | Freq: Two times a day (BID) | ORAL | 0 refills | Status: AC

## 2023-04-27 MED ORDER — ACETAMINOPHEN 500 MG PO TABS: 1000.0000 mg | ORAL_TABLET | Freq: Four times a day (QID) | ORAL | Status: AC

## 2023-04-27 MED ORDER — SENNA 8.6 MG PO TABS: 2.0000 | ORAL_TABLET | Freq: Every day | ORAL | 0 refills | Status: AC

## 2023-04-27 NOTE — Plan of Care (Signed)
  Problem: Health Behavior/Discharge Planning: Goal: Ability to manage health-related needs will improve Outcome: Progressing   Problem: Clinical Measurements: Goal: Ability to maintain clinical measurements within normal limits will improve Outcome: Progressing Goal: Will remain free from infection Outcome: Progressing Goal: Diagnostic test results will improve Outcome: Progressing Goal: Respiratory complications will improve Outcome: Progressing Goal: Cardiovascular complication will be avoided Outcome: Progressing   Problem: Activity: Goal: Risk for activity intolerance will decrease Outcome: Progressing   Problem: Nutrition: Goal: Adequate nutrition will be maintained Outcome: Progressing   Problem: Coping: Goal: Level of anxiety will decrease Outcome: Progressing   Problem: Elimination: Goal: Will not experience complications related to bowel motility Outcome: Progressing Goal: Will not experience complications related to urinary retention Outcome: Progressing   Problem: Pain Management: Goal: General experience of comfort will improve Outcome: Progressing   Problem: Safety: Goal: Ability to remain free from injury will improve Outcome: Progressing   Problem: Skin Integrity: Goal: Risk for impaired skin integrity will decrease Outcome: Progressing   Problem: Education: Goal: Knowledge of the prescribed therapeutic regimen will improve Outcome: Progressing   Problem: Activity: Goal: Ability to avoid complications of mobility impairment will improve Outcome: Progressing Goal: Range of joint motion will improve Outcome: Progressing   Problem: Clinical Measurements: Goal: Postoperative complications will be avoided or minimized Outcome: Progressing   Problem: Pain Management: Goal: Pain level will decrease with appropriate interventions Outcome: Progressing   Problem: Skin Integrity: Goal: Will show signs of wound healing Outcome: Progressing

## 2023-04-27 NOTE — Progress Notes (Signed)
   Subjective: 1 Day Post-Op Procedure(s) (LRB): TOTAL KNEE ARTHROPLASTY (Right) Patient reports pain as mild.   Patient seen in rounds with Dr. Charlann Boxer. Patient is well, and has had no acute complaints or problems. No acute events overnight. Foley catheter removed. Patient ambulated 8 feet with PT, limited by quad weakness yesterday. We will start therapy today.   Objective: Vital signs in last 24 hours: Temp:  [97.5 F (36.4 C)-98.7 F (37.1 C)] 98.3 F (36.8 C) (12/18 0411) Pulse Rate:  [45-80] 51 (12/18 0411) Resp:  [13-21] 18 (12/18 0411) BP: (101-136)/(59-87) 131/75 (12/18 0411) SpO2:  [90 %-97 %] 96 % (12/18 0411)  Intake/Output from previous day:  Intake/Output Summary (Last 24 hours) at 04/27/2023 0751 Last data filed at 04/27/2023 0736 Gross per 24 hour  Intake 2014.22 ml  Output 2425 ml  Net -410.78 ml     Intake/Output this shift: Total I/O In: -  Out: 550 [Urine:550]  Labs: Recent Labs    04/27/23 0358  HGB 14.1   Recent Labs    04/27/23 0358  WBC 22.0*  RBC 4.39  HCT 40.6  PLT 195   Recent Labs    04/27/23 0358  NA 134*  K 4.2  CL 104  CO2 21*  BUN 21  CREATININE 0.69  GLUCOSE 120*  CALCIUM 8.4*   No results for input(s): "LABPT", "INR" in the last 72 hours.  Exam: General - Patient is Alert and Oriented Extremity - Neurologically intact Sensation intact distally Intact pulses distally Dorsiflexion/Plantar flexion intact Dressing - dressing C/D/I Motor Function - intact, moving foot and toes well on exam.   Past Medical History:  Diagnosis Date   Arthritis    Coronary artery disease    History of kidney stones    Hypertension    Lumbar stenosis with neurogenic claudication    Sleep apnea    Uses Cpap nightly    Assessment/Plan: 1 Day Post-Op Procedure(s) (LRB): TOTAL KNEE ARTHROPLASTY (Right) Principal Problem:   S/P total knee arthroplasty, right  Estimated body mass index is 32.14 kg/m as calculated from the  following:   Height as of this encounter: 6\' 4"  (1.93 m).   Weight as of this encounter: 119.7 kg. Advance diet Up with therapy D/C IV fluids   Patient's anticipated LOS is less than 2 midnights, meeting these requirements: - Younger than 48 - Lives within 1 hour of care - Has a competent adult at home to recover with post-op recover - NO history of  - Chronic pain requiring opiods  - Diabetes  - Coronary Artery Disease  - Heart failure  - Heart attack  - Stroke  - DVT/VTE  - Cardiac arrhythmia  - Respiratory Failure/COPD  - Renal failure  - Anemia  - Advanced Liver disease     DVT Prophylaxis - Aspirin Weight bearing as tolerated.  Hgb stable at 14.1 this AM.  Plan is to go Home after hospital stay. Plan for discharge today following 1-2 sessions of PT as long as they are meeting their goals. Patient is scheduled for OPPT. Follow up in the office in 2 weeks.   He wondered about his next follow up appointment, which is scheduled on 05/12/23 at 2 PM at our office.  Rosalene Billings, PA-C Orthopedic Surgery [phone] 04/27/2023, 7:51 AM

## 2023-04-27 NOTE — Progress Notes (Signed)
During bedside report, patient made a comment about taking his own pain medications if it's easier for staff. This RN and accepting RN Kiristin told patient and patient's wife absolutely not to take any medications from home as it is unsafe and a liability as the risk of overdose is high. Patient's wife stated "Oh he's not going to do that, he doesn't even have any."

## 2023-04-27 NOTE — Progress Notes (Signed)
Physical Therapy Treatment Patient Details Name: Brian Franco MRN:  DOB: 1955/09/21 Today's Date: 04/27/2023   History of Present Illness 67 yo male S/P R TKA 04/26/23. PMH: Lumbar fusion, CABG, CAD    PT Comments  The patient reports that he ambulate with Rn. Instructed in HEP , patient anxious to DC home. No further PT providied as patient ready to DC.    If plan is discharge home, recommend the following: A little help with walking and/or transfers;Assistance with cooking/housework;Assist for transportation;Help with stairs or ramp for entrance   Can travel by private vehicle        Equipment Recommendations       Recommendations for Other Services       Precautions / Restrictions       Mobility  Bed Mobility                    Transfers                        Ambulation/Gait                   Stairs             Wheelchair Mobility     Tilt Bed    Modified Rankin (Stroke Patients Only)       Balance                                            Cognition                                                Exercises Total Joint Exercises Quad Sets: AROM, Right, 5 reps Straight Leg Raises: AROM, Right, 5 reps Long Arc Quad: AROM, Right, 5 reps Knee Flexion: AROM, Right, 5 reps    General Comments        Pertinent Vitals/Pain      Home Living                          Prior Function            PT Goals (current goals can now be found in the care plan section) Progress towards PT goals: Progressing toward goals    Frequency    7X/week      PT Plan      Co-evaluation              AM-PAC PT "6 Clicks" Mobility   Outcome Measure  Help needed turning from your back to your side while in a flat bed without using bedrails?: None Help needed moving from lying on your back to sitting on the side of a flat bed without using bedrails?: None Help needed  moving to and from a bed to a chair (including a wheelchair)?: None Help needed standing up from a chair using your arms (e.g., wheelchair or bedside chair)?: None Help needed to walk in hospital room?: A Little Help needed climbing 3-5 steps with a railing? : A Little 6 Click Score: 22    End of Session   Activity Tolerance: Patient tolerated treatment well Patient left: in bed;with family/visitor present;with call bell/phone within reach  Nurse Communication: Mobility status PT Visit Diagnosis: Unsteadiness on feet (R26.81);Muscle weakness (generalized) (M62.81);Difficulty in walking, not elsewhere classified (R26.2);Other symptoms and signs involving the nervous system (R29.898)     Time: 4098-1191 PT Time Calculation (min) (ACUTE ONLY): 10 min  Charges:    $Therapeutic Exercise: 8-22 mins PT General Charges $$ ACUTE PT VISIT: 1 Visit                     Blanchard Kelch PT Acute Rehabilitation Services Office [phone] Weekend pager-[phone]    Rada Hay 04/27/2023, 1:19 PM

## 2023-04-27 NOTE — Progress Notes (Signed)
Patient very anxious and wants to complete therapy in order to go home. RN ambulated w/ patient in halls 186ft, discussed proper walker use and safety. Patient tolerated well, positioned at side of bed awaiting therapy exercises. Patient appears much more at ease after walking.

## 2023-04-27 NOTE — Progress Notes (Signed)
While administering am medications, patient's wife informed RN that patient had already taken the bulk of his home medications. RN reminded patient and wife about conversation last night about not taking home medications d/t the risk of complications. Patient's wife apologized, stated "They told us not to take any vitamins but that he can take his heart medications. And I didn't give him any pain medications like you said." Discussed w/ both not to take any medications not provided by nurses while in hospital. Both apologized and stated will not happen again. Patient's wife declined to allow RN to take meds to Pharmacy, stating they expect to d/c to home very soon.

## 2023-04-27 NOTE — TOC Transition Note (Signed)
Transition of Care [facility]) - Discharge Note   Patient Details  Name: Brian Franco MRN: [id number] Date of Birth: 02-20-1956  Transition of [facility]) CM/SW Contact:  Amada Jupiter, LCSW Phone Number: 04/27/2023, 12:17 PM   Clinical Narrative:    Met with pt who reports he does need a RW for home use and no DME agency preference.  Order placed with [employer] and RW has been delivered to room.  OPPT already arranged with [facility] PT.  No further TOC needs.   Final next level of care: OP Rehab Barriers to Discharge: No Barriers Identified   Patient Goals and CMS Choice Patient states their goals for this hospitalization and ongoing recovery are:: return home          Discharge Placement                       Discharge Plan and Services Additional resources added to the After Visit Summary for                  DME Arranged: Walker rolling DME Agency: [employer] Date DME Agency Contacted: 04/27/23 Time DME Agency Contacted: [id number] Representative spoke with at DME Agency: Loraine Leriche            Social Drivers of Health (SDOH) Interventions SDOH Screenings   Food Insecurity: No Food Insecurity (04/26/2023)  Housing: Low Risk  (04/26/2023)  Transportation Needs: No Transportation Needs (04/26/2023)  Utilities: Not At Risk (04/26/2023)  Financial Resource Strain: Low Risk  (04/06/2023)   Received from [facility]  Physical Activity: Sufficiently Active (04/06/2023)   Received from [facility]  Social Connections: Socially Integrated (04/06/2023)   Received from [facility]  Stress: No Stress Concern Present (04/06/2023)   Received from [facility]  Tobacco Use: High Risk (04/26/2023)     Readmission Risk Interventions     No data to display

## 2023-04-27 NOTE — Progress Notes (Signed)
Patient discharged to home w/ family. Given all belongings, instructions, equipment. Verbalized understanding of all instructions. Escorted to pov via w/c.
